# Patient Record
Sex: Male | Born: 1995 | Hispanic: No | Marital: Single | Smoking: Current every day smoker
Health system: Southern US, Community
[De-identification: ages and names within clinical notes are randomized; demographics above are authoritative.]

## PROBLEM LIST (undated history)

## (undated) DIAGNOSIS — F1910 Other psychoactive substance abuse, uncomplicated: Secondary | ICD-10-CM

---
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
## 2015-04-10 ENCOUNTER — Encounter (HOSPITAL_COMMUNITY): Payer: Self-pay | Admitting: Emergency Medicine

## 2015-04-10 ENCOUNTER — Emergency Department (HOSPITAL_COMMUNITY)
Admission: EM | Admit: 2015-04-10 | Discharge: 2015-04-10 | Disposition: A | Discharge status: Home / Self Care | Payer: No Typology Code available for payment source | Attending: Emergency Medicine | Admitting: Emergency Medicine

## 2015-04-10 DIAGNOSIS — S3992XA Unspecified injury of lower back, initial encounter: Secondary | ICD-10-CM | POA: Diagnosis present

## 2015-04-10 DIAGNOSIS — Y9389 Activity, other specified: Secondary | ICD-10-CM | POA: Diagnosis not present

## 2015-04-10 DIAGNOSIS — Z8659 Personal history of other mental and behavioral disorders: Secondary | ICD-10-CM | POA: Insufficient documentation

## 2015-04-10 DIAGNOSIS — T148 Other injury of unspecified body region: Secondary | ICD-10-CM | POA: Insufficient documentation

## 2015-04-10 DIAGNOSIS — Y92410 Unspecified street and highway as the place of occurrence of the external cause: Secondary | ICD-10-CM | POA: Insufficient documentation

## 2015-04-10 DIAGNOSIS — T07XXXA Unspecified multiple injuries, initial encounter: Secondary | ICD-10-CM

## 2015-04-10 DIAGNOSIS — Z720 Tobacco use: Secondary | ICD-10-CM | POA: Diagnosis not present

## 2015-04-10 DIAGNOSIS — Y998 Other external cause status: Secondary | ICD-10-CM | POA: Insufficient documentation

## 2015-04-10 DIAGNOSIS — S199XXA Unspecified injury of neck, initial encounter: Secondary | ICD-10-CM | POA: Diagnosis not present

## 2015-04-10 MED ORDER — TRAMADOL HCL 50 MG PO TABS: ORAL_TABLET | ORAL | Status: DC

## 2015-04-10 MED ORDER — ACETAMINOPHEN 500 MG PO TABS
500.0000 mg | ORAL_TABLET | Freq: Once | ORAL | Status: AC
  Administered 2015-04-10: 500 mg via ORAL
  Filled 2015-04-10: qty 1

## 2015-04-10 MED ORDER — IBUPROFEN 800 MG PO TABS: 800.0000 mg | ORAL_TABLET | Freq: Three times a day (TID) | ORAL | Status: DC

## 2015-04-10 MED ORDER — BACLOFEN 10 MG PO TABS: 10.0000 mg | ORAL_TABLET | Freq: Three times a day (TID) | ORAL | Status: AC

## 2015-04-10 MED ORDER — IBUPROFEN 800 MG PO TABS
800.0000 mg | ORAL_TABLET | Freq: Once | ORAL | Status: AC
  Administered 2015-04-10: 800 mg via ORAL
  Filled 2015-04-10: qty 1
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
## 2016-08-12 ENCOUNTER — Encounter (HOSPITAL_COMMUNITY): Payer: Self-pay

## 2016-08-12 DIAGNOSIS — K529 Noninfective gastroenteritis and colitis, unspecified: Secondary | ICD-10-CM | POA: Insufficient documentation

## 2016-08-12 DIAGNOSIS — F17200 Nicotine dependence, unspecified, uncomplicated: Secondary | ICD-10-CM | POA: Insufficient documentation

## 2016-08-12 DIAGNOSIS — Z79899 Other long term (current) drug therapy: Secondary | ICD-10-CM | POA: Insufficient documentation

## 2016-08-12 NOTE — ED Triage Notes (Signed)
I have been vomiting, dizzy, and had a headache.  Started around 5 pm today.

## 2016-08-13 ENCOUNTER — Emergency Department (HOSPITAL_COMMUNITY)
Admission: EM | Admit: 2016-08-13 | Discharge: 2016-08-13 | Disposition: A | Discharge status: Home / Self Care | Payer: Self-pay | Attending: Emergency Medicine | Admitting: Emergency Medicine

## 2016-08-13 DIAGNOSIS — K529 Noninfective gastroenteritis and colitis, unspecified: Secondary | ICD-10-CM

## 2016-08-13 MED ORDER — ONDANSETRON HCL 4 MG PO TABS: 4.0000 mg | ORAL_TABLET | Freq: Four times a day (QID) | ORAL | 0 refills | Status: DC

## 2016-08-13 MED ORDER — ONDANSETRON HCL 4 MG PO TABS
8.0000 mg | ORAL_TABLET | Freq: Once | ORAL | Status: AC
  Administered 2016-08-13: 8 mg via ORAL
  Filled 2016-08-13: qty 2

## 2016-08-13 MED ORDER — ONDANSETRON HCL 4 MG PO TABS: 4.0000 mg | ORAL_TABLET | Freq: Once | ORAL | Status: DC

## 2016-08-13 NOTE — ED Provider Notes (Signed)
[facility]-EMERGENCY DEPT Provider Note   CSN: [id number] Arrival date & time: 08/12/16  2249     History   Chief Complaint Chief Complaint  Patient presents with  . Emesis    HPI Joshua Mahoney is a 21 y.o. male.  The history is provided by the patient.  Emesis   This is a new problem. The current episode started 6 to 12 hours ago. The problem occurs 2 to 4 times per day. The problem has been gradually worsening. The emesis has an appearance of stomach contents. There has been no fever. Associated symptoms include headaches. Pertinent negatives include no abdominal pain, no arthralgias, no chills, no cough, no diarrhea, no fever, no myalgias and no URI.    Past Medical History:  Diagnosis Date  . Polysubstance abuse     There are no active problems to display for this patient.   History reviewed. No pertinent surgical history.     Home Medications    Prior to Admission medications   Medication Sig Start Date End Date Taking? Authorizing Provider  ibuprofen (ADVIL,MOTRIN) 800 MG tablet Take 1 tablet (800 mg total) by mouth 3 (three) times daily. 04/10/15   Ivery Quale, PA-C  traMADol Janean Sark) 50 MG tablet 1 or 2 po q6h prn pain 04/10/15   Ivery Quale, PA-C    Family History No family history on file.  Social History Social History  Substance Use Topics  . Smoking status: Current Every Day Smoker  . Smokeless tobacco: Never Used  . Alcohol use No     Allergies   Patient has no known allergies.   Review of Systems Review of Systems  Constitutional: Negative for activity change, chills and fever.       All ROS Neg except as noted in HPI  HENT: Negative for nosebleeds.   Eyes: Negative for photophobia and discharge.  Respiratory: Negative for cough, shortness of breath and wheezing.   Cardiovascular: Negative for chest pain and palpitations.  Gastrointestinal: Positive for vomiting. Negative for abdominal pain, blood in stool and diarrhea.  Genitourinary:  Negative for dysuria, frequency and hematuria.  Musculoskeletal: Negative for arthralgias, back pain, myalgias and neck pain.  Skin: Negative.   Neurological: Positive for dizziness and headaches. Negative for seizures and speech difficulty.  Psychiatric/Behavioral: Negative for confusion and hallucinations.     Physical Exam Updated Vital Signs BP 125/67 (BP Location: Left Arm)   Pulse 71   Temp 97.9 F (36.6 C) (Oral)   Resp 16   Ht 5\' 10"  (1.778 m)   Wt 79.4 kg   SpO2 100%   BMI 25.11 kg/m   Physical Exam  Constitutional: He is oriented to person, place, and time. He appears well-developed and well-nourished.  Non-toxic appearance.  HENT:  Head: Normocephalic.  Right Ear: Tympanic membrane and external ear normal.  Left Ear: Tympanic membrane and external ear normal.  Eyes: EOM and lids are normal. Pupils are equal, round, and reactive to light.  Neck: Normal range of motion. Neck supple. Carotid bruit is not present.  Cardiovascular: Normal rate, regular rhythm, normal heart sounds, intact distal pulses and normal pulses.   Pulmonary/Chest: Breath sounds normal. No respiratory distress.  Abdominal: Soft. Bowel sounds are normal. There is no tenderness. There is no guarding.  Musculoskeletal: Normal range of motion.  Lymphadenopathy:       Head (right side): No submandibular adenopathy present.       Head (left side): No submandibular adenopathy present.    He has no  cervical adenopathy.  Neurological: He is alert and oriented to person, place, and time. He has normal strength. No cranial nerve deficit or sensory deficit.  Skin: Skin is warm and dry.  Psychiatric: He has a normal mood and affect. His speech is normal.  Nursing note and vitals reviewed.    ED Treatments / Results  Labs (all labs ordered are listed, but only abnormal results are displayed) Labs Reviewed - No data to display  EKG  EKG Interpretation None       Radiology No results  found.  Procedures Procedures (including critical care time)  Medications Ordered in ED Medications - No data to display   Initial Impression / Assessment and Plan / ED Course  I have reviewed the triage vital signs and the nursing notes.  Pertinent labs & imaging results that were available during my care of the patient were reviewed by me and considered in my medical decision making (see chart for details).     *I have reviewed nursing notes, vital signs, and all appropriate lab and imaging results for this patient.**  Final Clinical Impressions(s) / ED Diagnoses  MDM Vital signs within normal limits. Patient will be treated with Zofran-O. Recheck. Patient states he's feeling better. Has not had any vomiting.  Recheck patient resting quietly. No vomiting since being in the emergency department or receiving medication. Patient will attempt drinking liquids for fluid challenge.   Recheck. No vomiting with fluid challenge. Patient will be given prescription for Zofran to use on. I suspect the patient has a viral gastroenteritis. Questions were answered. Patient is in agreement with this discharge plan.    Final diagnoses:  None    New Prescriptions New Prescriptions   No medications on file     Ivery QualeHobson Lothar Prehn, PA-C 08/13/16 0106    Shon Batonourtney F Horton, MD 08/13/16 0300

## 2016-08-13 NOTE — Discharge Instructions (Signed)
Your examination suggest a viral illness (viral gastroenteritis). Please use clear liquids over the next 24 hours, then gradually advance your diet to solid foods. Use Zofran every 6 hours for nausea/vomiting. Please see your primary physician, or return to the emergency department if any changes, problems, or concerns. Please wash hands frequently until the symptoms have resolved.

## 2016-08-13 NOTE — ED Notes (Signed)
Pt given sprite to drink. 

## 2018-04-13 ENCOUNTER — Encounter (HOSPITAL_COMMUNITY): Payer: Self-pay | Admitting: *Deleted

## 2018-04-13 ENCOUNTER — Other Ambulatory Visit: Payer: Self-pay

## 2018-04-13 ENCOUNTER — Emergency Department (HOSPITAL_COMMUNITY): Payer: Self-pay

## 2018-04-13 ENCOUNTER — Emergency Department (HOSPITAL_COMMUNITY)
Admission: EM | Admit: 2018-04-13 | Discharge: 2018-04-13 | Disposition: A | Discharge status: Home / Self Care | Payer: Self-pay | Attending: Emergency Medicine | Admitting: Emergency Medicine

## 2018-04-13 DIAGNOSIS — M549 Dorsalgia, unspecified: Secondary | ICD-10-CM | POA: Insufficient documentation

## 2018-04-13 DIAGNOSIS — R1012 Left upper quadrant pain: Secondary | ICD-10-CM | POA: Insufficient documentation

## 2018-04-13 DIAGNOSIS — Y92410 Unspecified street and highway as the place of occurrence of the external cause: Secondary | ICD-10-CM | POA: Insufficient documentation

## 2018-04-13 DIAGNOSIS — Y9389 Activity, other specified: Secondary | ICD-10-CM | POA: Insufficient documentation

## 2018-04-13 DIAGNOSIS — Z87891 Personal history of nicotine dependence: Secondary | ICD-10-CM | POA: Insufficient documentation

## 2018-04-13 DIAGNOSIS — Y998 Other external cause status: Secondary | ICD-10-CM | POA: Insufficient documentation

## 2018-04-13 DIAGNOSIS — S161XXA Strain of muscle, fascia and tendon at neck level, initial encounter: Secondary | ICD-10-CM
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
## 2018-04-13 NOTE — Discharge Instructions (Signed)
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
## 2018-04-13 NOTE — ED Notes (Signed)
Pt very argumentative at discharge stating "you mean to tell me there's nothing wrong with me."  Informed the scans and xrays we performed were normal.  Pt upset about this.  Ambulated to his friend's room without difficulty.  Refused repeat VS.

## 2018-04-13 NOTE — ED Notes (Signed)
Patient transported to CT 

## 2019-06-04 ENCOUNTER — Encounter (HOSPITAL_COMMUNITY): Payer: Self-pay

## 2019-06-04 ENCOUNTER — Other Ambulatory Visit: Payer: Self-pay

## 2019-06-04 ENCOUNTER — Emergency Department (HOSPITAL_COMMUNITY)
Admission: EM | Admit: 2019-06-04 | Discharge: 2019-06-04 | Disposition: A | Discharge status: Home / Self Care | Payer: Self-pay | Attending: Emergency Medicine | Admitting: Emergency Medicine

## 2019-06-04 ENCOUNTER — Inpatient Hospital Stay (HOSPITAL_COMMUNITY)
Admission: AD | Admit: 2019-06-04 | Discharge: 2019-06-09 | DRG: 885 | Disposition: A | Discharge status: Home / Self Care | Payer: No Typology Code available for payment source | Attending: Psychiatry | Admitting: Psychiatry

## 2019-06-04 DIAGNOSIS — F10239 Alcohol dependence with withdrawal, unspecified: Secondary | ICD-10-CM | POA: Diagnosis present

## 2019-06-04 DIAGNOSIS — F17210 Nicotine dependence, cigarettes, uncomplicated: Secondary | ICD-10-CM | POA: Insufficient documentation

## 2019-06-04 DIAGNOSIS — F23 Brief psychotic disorder: Secondary | ICD-10-CM | POA: Insufficient documentation

## 2019-06-04 DIAGNOSIS — Z20828 Contact with and (suspected) exposure to other viral communicable diseases: Secondary | ICD-10-CM | POA: Insufficient documentation

## 2019-06-04 DIAGNOSIS — Z7289 Other problems related to lifestyle: Secondary | ICD-10-CM | POA: Diagnosis not present

## 2019-06-04 DIAGNOSIS — F2081 Schizophreniform disorder: Principal | ICD-10-CM | POA: Diagnosis present

## 2019-06-04 DIAGNOSIS — G4700 Insomnia, unspecified: Secondary | ICD-10-CM | POA: Diagnosis present

## 2019-06-04 DIAGNOSIS — F29 Unspecified psychosis not due to a substance or known physiological condition: Secondary | ICD-10-CM | POA: Diagnosis not present

## 2019-06-04 DIAGNOSIS — R443 Hallucinations, unspecified: Secondary | ICD-10-CM | POA: Diagnosis present

## 2019-06-04 LAB — CBC
HCT: 46.3 % (ref 39.0–52.0)
Hemoglobin: 15.4 g/dL (ref 13.0–17.0)
MCH: 31.4 pg (ref 26.0–34.0)
MCHC: 33.3 g/dL (ref 30.0–36.0)
MCV: 94.5 fL (ref 80.0–100.0)
Platelets: 250 10*3/uL (ref 150–400)
RBC: 4.9 MIL/uL (ref 4.22–5.81)
RDW: 11.5 % (ref 11.5–15.5)
WBC: 17 10*3/uL — ABNORMAL HIGH (ref 4.0–10.5)
nRBC: 0 % (ref 0.0–0.2)

## 2019-06-04 LAB — COMPREHENSIVE METABOLIC PANEL
ALT: 31 U/L (ref 0–44)
AST: 37 U/L (ref 15–41)
Albumin: 4.7 g/dL (ref 3.5–5.0)
Alkaline Phosphatase: 59 U/L (ref 38–126)
Anion gap: 11 (ref 5–15)
BUN: 10 mg/dL (ref 6–20)
CO2: 26 mmol/L (ref 22–32)
Calcium: 9.1 mg/dL (ref 8.9–10.3)
Chloride: 102 mmol/L (ref 98–111)
Creatinine, Ser: 0.83 mg/dL (ref 0.61–1.24)
GFR calc Af Amer: >60 mL/min (ref >60)
GFR calc non Af Amer: >60 mL/min (ref >60)
Glucose, Bld: 96 mg/dL (ref 70–99)
Potassium: 3.9 mmol/L (ref 3.5–5.1)
Sodium: 139 mmol/L (ref 135–145)
Total Bilirubin: 0.7 mg/dL (ref 0.3–1.2)
Total Protein: 7.5 g/dL (ref 6.5–8.1)

## 2019-06-04 LAB — URINALYSIS, ROUTINE W REFLEX MICROSCOPIC
Bacteria, UA: NONE SEEN
Bilirubin Urine: NEGATIVE
Glucose, UA: NEGATIVE mg/dL
Hgb urine dipstick: NEGATIVE
Ketones, ur: NEGATIVE mg/dL
Leukocytes,Ua: NEGATIVE
Nitrite: NEGATIVE
Protein, ur: 30 mg/dL — AB
Specific Gravity, Urine: 1.024 (ref 1.005–1.030)
pH: 7 (ref 5.0–8.0)

## 2019-06-04 LAB — RAPID URINE DRUG SCREEN, HOSP PERFORMED
Amphetamines: NOT DETECTED
Barbiturates: NOT DETECTED
Benzodiazepines: NOT DETECTED
Cocaine: NOT DETECTED
Opiates: NOT DETECTED
Tetrahydrocannabinol: POSITIVE — AB

## 2019-06-04 LAB — SALICYLATE LEVEL: Salicylate Lvl: <7 mg/dL (ref 2.8–30.0)

## 2019-06-04 LAB — ACETAMINOPHEN LEVEL: Acetaminophen (Tylenol), Serum: <10 ug/mL — ABNORMAL LOW (ref 10–30)

## 2019-06-04 LAB — ETHANOL: Alcohol, Ethyl (B): <10 mg/dL (ref <10)

## 2019-06-04 LAB — SARS CORONAVIRUS 2 BY RT PCR (HOSPITAL ORDER, PERFORMED IN ~~LOC~~ HOSPITAL LAB): SARS Coronavirus 2: NEGATIVE

## 2019-06-04 MED ORDER — ACETAMINOPHEN 325 MG PO TABS
650.0000 mg | ORAL_TABLET | Freq: Four times a day (QID) | ORAL | Status: DC | PRN
  Administered 2019-06-06: 650 mg via ORAL
  Filled 2019-06-04: qty 2

## 2019-06-04 MED ORDER — LORAZEPAM 1 MG PO TABS: 1.0000 mg | ORAL_TABLET | ORAL | Status: DC | PRN

## 2019-06-04 MED ORDER — OLANZAPINE 10 MG IM SOLR
10.0000 mg | Freq: Once | INTRAMUSCULAR | Status: DC
  Filled 2019-06-04: qty 10

## 2019-06-04 MED ORDER — MAGNESIUM HYDROXIDE 400 MG/5ML PO SUSP: 30.0000 mL | Freq: Every day | ORAL | Status: DC | PRN

## 2019-06-04 MED ORDER — ALUM & MAG HYDROXIDE-SIMETH 200-200-20 MG/5ML PO SUSP: 30.0000 mL | ORAL | Status: DC | PRN

## 2019-06-04 MED ORDER — HYDROXYZINE HCL 25 MG PO TABS: 25.0000 mg | ORAL_TABLET | Freq: Three times a day (TID) | ORAL | Status: DC | PRN

## 2019-06-04 MED ORDER — DIPHENHYDRAMINE HCL 50 MG/ML IJ SOLN
50.0000 mg | Freq: Four times a day (QID) | INTRAMUSCULAR | Status: DC | PRN
  Administered 2019-06-05: 50 mg via INTRAMUSCULAR
  Filled 2019-06-04: qty 1

## 2019-06-04 MED ORDER — TRAZODONE HCL 100 MG PO TABS: 100.0000 mg | ORAL_TABLET | Freq: Every evening | ORAL | Status: DC | PRN

## 2019-06-04 MED ORDER — OLANZAPINE 5 MG PO TABS
5.0000 mg | ORAL_TABLET | Freq: Three times a day (TID) | ORAL | Status: DC | PRN
  Administered 2019-06-04: 13:00:00 5 mg via ORAL
  Filled 2019-06-04: qty 1

## 2019-06-04 MED ORDER — DIPHENHYDRAMINE HCL 50 MG/ML IJ SOLN
INTRAMUSCULAR | Status: AC
  Administered 2019-06-04: 50 mg via INTRAMUSCULAR
  Filled 2019-06-04: qty 1

## 2019-06-04 MED ORDER — DIPHENHYDRAMINE HCL 25 MG PO CAPS
50.0000 mg | ORAL_CAPSULE | Freq: Four times a day (QID) | ORAL | Status: DC | PRN
  Administered 2019-06-05: 50 mg via ORAL
  Filled 2019-06-04: qty 2

## 2019-06-04 MED ORDER — OLANZAPINE 10 MG PO TABS
10.0000 mg | ORAL_TABLET | Freq: Every day | ORAL | Status: DC
  Administered 2019-06-06 (×2): 10 mg via ORAL
  Filled 2019-06-04 (×4): qty 1

## 2019-06-04 MED ORDER — NICOTINE 21 MG/24HR TD PT24
21.0000 mg | MEDICATED_PATCH | Freq: Once | TRANSDERMAL | Status: DC
  Administered 2019-06-04: 13:00:00 21 mg via TRANSDERMAL
  Filled 2019-06-04: qty 1

## 2019-06-04 MED ORDER — ZIPRASIDONE MESYLATE 20 MG IM SOLR
20.0000 mg | Freq: Two times a day (BID) | INTRAMUSCULAR | Status: DC | PRN
  Administered 2019-06-04: 20 mg via INTRAMUSCULAR
  Filled 2019-06-04 (×2): qty 20

## 2019-06-04 NOTE — ED Provider Notes (Signed)
  Face-to-face evaluation   History: Patient presents for evaluation of delusional thoughts and hearing voices.  No reported command hallucinations.  He is taking Ambien to help him sleep but it is not helping.  Physical exam: He is agitated, appears angry, and walked out of the emergency department as I watched him, followed by security.  IVC commitment initiated-8:50 AM  Medical screening examination/treatment/procedure(s) were conducted as a shared visit with non-physician practitioner(s) and myself.  I personally evaluated the patient during the encounter   Daleen Bo, MD 06/05/19 [phone]

## 2019-06-04 NOTE — [facility] Counselor (Signed)
Pt accepted to [facility] 508-1 pending negative COVID test.

## 2019-06-04 NOTE — ED Notes (Signed)
IVC papers faxed to [facility] after being served by [facility].

## 2019-06-04 NOTE — ED Notes (Signed)
New contact # for [employer] [phone].  Called Danny at [facility] and informed of the new #.  Pt's father Audry Pili called and wanted update on the Pt.  Informed the nurse.
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
## 2019-06-04 NOTE — BH Assessment (Signed)
[facility] Assessment Progress Note  Per Neita Garnet, MD, this pt requires psychiatric hospitalization.  At 13:09 Kathalene Frames, RN tentatively assigned pt to [facility] Rm 501-1 pending negative Covid-19 test results.  Pt reportedly presents under IVC.  At 13:12 I called [facility] and spoke to Coronita, the charge nurse, to notify her of the tentative bed assignment and the conditions noted above.  She agrees to fax IVC documents to [phone].  Please call report to [phone] when the time comes.  At 13:15 I called Elmarie Shiley, NP and notified her.  At 13:16 I called Jacqulyn Cane, Patient Access Specialist, and notified him as well.  Pt is to be transported via Event [facility].   Jalene Mullet, [place] Coordinator [phone]

## 2019-06-04 NOTE — ED Notes (Signed)
Pt resting in bed at this time. 

## 2019-06-04 NOTE — BH Assessment (Addendum)
Tele Assessment Note   Patient Name: Joshua Mahoney MRN: [id number] Referring Physician: Effie Shy Location of [facility]: [facility] Location of Provider: Behavioral Health TTS Department  Joshua Mahoney is an 23 y.o. male who initially prsented to the [facility] voluntarily with the [facility], but became aggressive in the ED and was trying to leave and had to be placed on an IVC.  Per ED Report:  23 y.o. male who presents to the Emergency Department voluntarily by sheriff's dept, complaining of hearing voices and feelings of confusion.  Symptoms have been present for several weeks, but worse recently. He states that last evening he got into an arugment with his mother and left the house and was picked up by the police.  He states that he has been hearing voices that are telling him good things and other voices that are telling him bad things and he feeling like he doesn't know what he's suppose to do.  He states that he is a prophet of God, but that he also has the "mark of the beast."  He states that his mother is "always in my business and babies me." " I just was to go to sleep."  He denies suicidal or homicidal thoughts.  He was seen by Dr. Park Breed and given Remus Loffler, but he states that is not helping.  He denies fever, cough, abdominal pain, drug and alcohol use.  I spoke with his mother as well and she states that he has been agitated and has not slept in 48 hours.  She states he he has been telling her that if he goes to sleep that he will go to hell.  She also states that the Remus Loffler has made is agitation worse.  She contacted the police this morning after he broke her phone and he was telling her that he was in a coma.    TTS:  Patient was seen this morning and he appeared to be agitated, anxious and restless.  Patient admits to experiencing hallucinations and states that Ambien makes them worse.  Patient has not been sleeping well.  He presents in a psychotic state.  Patient states, "I said some things on Tic-Tok that I  should not have said."  He states that he has been hearing voices that tell him that he is an angel and that Lucifer was an angel so he must be Lucifer.  He stated that "technology is getting into my head and it is making me paranoid."  Patient states that technology has a magnetic pull on him. Patient states that he drank a bottle of wine last night trying to drown out the voices.  He denies other drug use, but had a positive UDS for marijuana.   Patient stated, "I have a new Facebook page, now I  am being accused of owning a kingdom.  I don't want to sin, I only have seven days left to live."  TTS attempted to contact patient's mother on several occasions at [phone]. But there was no answer and no voicemail.  TTS contacted [facility] twice to see if they could located mother in the ED waiting room, but they could not find her.  Patient presented as paranoid, responding to internal stimuli.  He was irritable and becoming combative.  He was alert and oriented x 3.  Due to his agitation, he was unable to answer many questions asked of him and was becoming very uncooperative.  He was able to state that he has never been in a [facility] in the past.  His memory was impaired and this thoughts loose and disorganized.  TTS contacted patient's mother, Joshua Mahoney at [phone] for collateral information.  Mother states that patient  Has not been able to sleep for two weeks and is up all hours of the night.  No sleep at all in the past two nights.  Mother states that patient has never had any hallucinations in the past.  He has no history of any mental health treatment and she states that he has never been suicidal or homicidal in the past.  Was unable to get further information from her because of poor reception.  The phone actually went dead.   Diagnosis: F23 Brief Psychotic Disorder  Past Medical History:  Past Medical History:  Diagnosis Date  . Polysubstance abuse ([place])      History reviewed. No pertinent surgical history.  Family History: No family history on file.  Social History:  reports that he has been smoking cigarettes. He has been smoking about 1.00 pack per day. He has never used smokeless tobacco. He reports current alcohol use. He reports that he does not use drugs.  Additional Social History:  Alcohol / Drug Use Pain Medications: see MAR Prescriptions: see MAR Over the Counter: see MAR History of alcohol / drug use?: Yes Longest period of sobriety (when/how long): unknown, unable to assess, patient is psychotic Substance #1 Name of Substance 1: Alcohol 1 - Age of First Use: UTA 1 - Amount (size/oz): 1 bottle wine 1 - Frequency: drank last night 1 - Duration: unknown 1 - Last Use / Amount: last pm  CIWA: CIWA-Ar BP: 140/85 Pulse Rate: 77 COWS:    Allergies: No Known Allergies  Home Medications: (Not in a hospital admission)   OB/GYN Status:  No LMP for male patient.  General Assessment Data Location of Assessment: [facility] TTS Assessment: In system Is this a Tele or Face-to-Face Assessment?: Tele Assessment Is this an Initial Assessment or a Re-assessment for this encounter?: Initial Assessment Patient Accompanied by:: Parent Language Other than English: No Living Arrangements: Other (Comment)(lives with boyfriend) What gender do you identify as?: Male Marital status: Single Living Arrangements: Parent Can pt return to current living arrangement?: Yes Admission Status: Involuntary Petitioner: ED Attending Is patient capable of signing voluntary admission?: No Referral Source: Self/Family/Friend Insurance type: self-pay     Crisis Care Plan Living Arrangements: Parent Legal Guardian: Other:(Dr Toy Care) Name of Psychiatrist: Dr. Toy Care Name of Therapist: none  Education Status Is patient currently in school?: No Is the patient employed, unemployed or receiving disability?: (UTA)  Risk to self with the past 6  months Suicidal Ideation: No Has patient been a risk to self within the past 6 months prior to admission? : No Suicidal Intent: No Has patient had any suicidal intent within the past 6 months prior to admission? : No Is patient at risk for suicide?: No Suicidal Plan?: No Has patient had any suicidal plan within the past 6 months prior to admission? : No Access to Means: No What has been your use of drugs/alcohol within the last 12 months?: drank a bottle of wine last pm Previous Attempts/Gestures: No How many times?: 0 Other Self Harm Risks: psychosis Triggers for Past Attempts: None known Intentional Self Injurious Behavior: None Family Suicide History: Unable to assess Recent stressful life event(s): Other (Comment) Persecutory voices/beliefs?: (none reported) Depression: Yes Depression Symptoms: Despondent, Insomnia, Loss of interest in usual pleasures, Feeling worthless/self pity Substance abuse history and/or treatment for  substance abuse?: Yes Suicide prevention information given to non-admitted patients: Not applicable  Risk to Others within the past 6 months Homicidal Ideation: No Does patient have any lifetime risk of violence toward others beyond the six months prior to admission? : No Thoughts of Harm to Others: No Current Homicidal Intent: No Current Homicidal Plan: No Access to Homicidal Means: No Identified Victim: none History of harm to others?: No Assessment of Violence: On admission Violent Behavior Description: patient has been combative and posturing toward staff, but has not hit anyone Does patient have access to weapons?: (UTA) Criminal Charges Pending?: No Does patient have a court date: No Is patient on probation?: No  Psychosis Hallucinations: Auditory Delusions: (paranoid delusions)  Mental Status Report Appearance/Hygiene: Unremarkable Eye Contact: Good Motor Activity: Restlessness, Tremors Speech: Pressured Level of Consciousness:  Alert Mood: Depressed, Anxious Affect: Irritable, Labile Anxiety Level: Severe Thought Processes: Flight of Ideas, Thought Blocking Judgement: Impaired Orientation: Person, Place, Time, Situation Obsessive Compulsive Thoughts/Behaviors: None  Cognitive Functioning Concentration: Decreased Memory: Recent Intact, Remote Intact Is patient IDD: No Insight: Poor Impulse Control: Poor Appetite: Good Have you had any weight changes? : No Change Sleep: Decreased Total Hours of Sleep: (patient has not been sleeping) Vegetative Symptoms: None  ADLScreening [facility] Assessment Services) Patient's cognitive ability adequate to safely complete daily activities?: Yes Patient able to express need for assistance with ADLs?: Yes Independently performs ADLs?: Yes (appropriate for developmental age)  Prior Inpatient Therapy Prior Inpatient Therapy: No  Prior Outpatient Therapy Prior Outpatient Therapy: Yes Prior Therapy Dates: active Prior Therapy Facilty/Provider(s): Dr. Evelene Croon Reason for Treatment: insomnia Does patient have an ACCT team?: No Does patient have Intensive In-House Services?  : No Does patient have [facility] services? : No Does patient have P4CC services?: No  ADL Screening (condition at time of admission) Patient's cognitive ability adequate to safely complete daily activities?: Yes Is the patient deaf or have difficulty hearing?: No Does the patient have difficulty seeing, even when wearing glasses/contacts?: No Does the patient have difficulty concentrating, remembering, or making decisions?: No Patient able to express need for assistance with ADLs?: Yes Does the patient have difficulty dressing or bathing?: No Independently performs ADLs?: Yes (appropriate for developmental age) Does the patient have difficulty walking or climbing stairs?: No Weakness of Legs: None Weakness of Arms/Hands: None  Home Assistive Devices/Equipment Home Assistive Devices/Equipment:  None  Therapy Consults (therapy consults require a physician order) PT Evaluation Needed: No OT Evalulation Needed: No SLP Evaluation Needed: No Abuse/Neglect Assessment (Assessment to be complete while patient is alone) Abuse/Neglect Assessment Can Be Completed: Yes Physical Abuse: Denies Verbal Abuse: Denies Sexual Abuse: Denies Exploitation of patient/patient's resources: Denies Self-Neglect: Denies Values / Beliefs Cultural Requests During Hospitalization: None Spiritual Requests During Hospitalization: None Consults Spiritual Care Consult Needed: No Social Work Consult Needed: No Merchant navy officer (For Healthcare) Does Patient Have a Medical Advance Directive?: No Would patient like information on creating a medical advance directive?: No - Patient declined Nutrition Screen- [facility] Adult/[facility] Has the patient recently lost weight without trying?: No Has the patient been eating poorly because of a decreased appetite?: No Malnutrition Screening Tool Score: 0        Disposition: Per Dr. Jama Flavors, Inpatient Treatment is recommended Disposition Initial Assessment Completed for this Encounter: Yes  This service was provided via telemedicine using a 2-way, interactive audio and video technology.  Names of all persons participating in this telemedicine service and their role in this encounter. Name: Mikale Silversmith Role: patient  Name: Dannielle HuhDanny Blake Vetrano Role: TTS  Name:  Role:   Name:  Role:     Daphene CalamityDanny J Ashlay Altieri 06/04/2019 9:46 AM

## 2019-06-04 NOTE — ED Notes (Signed)
Security at bedside.  Pt pacing in the room. Pt yelling at this RN saying " lies, next questions, lie next question. Give me my ipad to call my mom"

## 2019-06-04 NOTE — Progress Notes (Signed)
Reviewed patient's chart for medication recommendations due to extreme agitation from current psychotic state. Recommend zyprexa zydis 5 mg po every eight hours prn agitation. If patient refuses can give in form of zyprexa injection of 10 mg IM once.

## 2019-06-04 NOTE — ED Notes (Signed)
Pt wanded by security. 

## 2019-06-04 NOTE — BH Assessment (Signed)
[facility] Assessment Progress Note   TTS contacted patient's mother, Lenell Antu at [phone] for collateral information.  Mother states that patient  Has not been able to sleep for two weeks and is up all hours of the night.  No sleep at all in the past two nights.  Mother states that patient has never had any hallucinations in the past.  He has no history of any mental health treatment and she states that he has never been suicidal or homicidal in the past.  Was unable to get further information from her because of poor reception.  The phone actually went dead.

## 2019-06-04 NOTE — ED Notes (Addendum)
Pt speaking loudly and not making sense of his thoughts.  Pt came out of room and asked for the phone.  This RN went into the room to give the pt the phone.  Pt became angry, started to walk up the hall, tour his shirt off and started to say " im leaving, im going to see my mom. God forgive me, God forgive me"

## 2019-06-04 NOTE — ED Notes (Signed)
Pt resting at this time.

## 2019-06-04 NOTE — ED Notes (Signed)
Brother updated.

## 2019-06-04 NOTE — ED Notes (Signed)
Unable to obtain VS due to pt being agitated. Will not sit down to take vitals

## 2019-06-04 NOTE — ED Notes (Signed)
RSD at bedside.

## 2019-06-04 NOTE — ED Notes (Signed)
IVC papers taken out and faxed to the [facility].  Called to let them know that they were faxed.

## 2019-06-04 NOTE — ED Triage Notes (Signed)
Pt brought in voluntary by deputy due to hallucinations for over a week. Mother called sheriffs dept due to not understanding what is going on with son. Pt reports that he is seeing things and hearing voices. Pt denies SI/HI. Pt reports that he has recently been going to church and feels he has the mark of the beast. He has seen Dr Chancy Milroy in [place] and given [place]

## 2019-06-04 NOTE — ED Provider Notes (Signed)
[facility] EMERGENCY DEPARTMENT Provider Note   CSN: [id number] Arrival date & time: 06/04/19  6578     History   Chief Complaint Chief Complaint  Patient presents with  . Hallucinations    HPI Joshua Mahoney is a 23 y.o. male.     HPI   Joshua Mahoney is a 23 y.o. male who presents to the Emergency Department voluntarily by sheriff's dept, complaining of hearing voices and feelings of confusion.  Symptoms have been present for several weeks, but worse recently. He states that last evening he got into an arugment with his mother and left the house and was picked up by the police.  He states that he has been hearing voices that are telling him good things and other voices that are telling him bad things and he feeling like he doesn't know what he's suppose to do.  He states that he is a prophet of God, but that he also has the "mark of the beast."  He states that his mother is "always in my business and babies me." " I just was to go to sleep."  He denies suicidal or homicidal thoughts.  He was seen by Dr. Chancy Milroy and given Lorrin Mais, but he states that is not helping.  He denies fever, cough, abdominal pain, drug and alcohol use.  I spoke with his mother as well and she states that he has been agitated and has not slept in 48 hours.  She states he he has been telling her that if he goes to sleep that he will go to hell.  She also states that the Lorrin Mais has made is agitation worse.  She contacted the police this morning after he broke her phone and he was telling her that he was in a coma.       Past Medical History:  Diagnosis Date  . Polysubstance abuse ([place])     There are no active problems to display for this patient.   History reviewed. No pertinent surgical history.    Home Medications    Prior to Admission medications   Medication Sig Start Date End Date Taking? Authorizing Provider  ibuprofen (ADVIL,MOTRIN) 800 MG tablet Take 1 tablet (800 mg total) by mouth 3 (three) times  daily. 04/10/15   Lily Kocher, PA-C  ondansetron (ZOFRAN) 4 MG tablet Take 1 tablet (4 mg total) by mouth every 6 (six) hours. 08/13/16   Lily Kocher, PA-C  traMADol Veatrice Bourbon) 50 MG tablet 1 or 2 po q6h prn pain 04/10/15   Lily Kocher, PA-C    Family History No family history on file.  Social History Social History   Tobacco Use  . Smoking status: Current Every Day Smoker    Packs/day: 1.00    Types: Cigarettes  . Smokeless tobacco: Never Used  Substance Use Topics  . Alcohol use: Yes    Comment: bottle of wine last night  . 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   Review of Systems Review of Systems  Unable to perform ROS: Psychiatric disorder     Physical Exam Updated Vital Signs BP 140/85 (BP Location: Right Arm)   Pulse 77   Temp 97.9 F (36.6 C) (Oral)   Resp 18   Ht 5\' 11"  (1.803 m)   Wt 75.8 kg   SpO2 98%   BMI 23.29 kg/m   Physical Exam Vitals signs and nursing note reviewed.  Constitutional:      General: He is not in acute distress.    Appearance: He is not toxic-appearing.  HENT:     Head: Atraumatic.  Eyes:     Extraocular Movements: Extraocular movements intact.  Neck:     Musculoskeletal: Normal range of motion.  Cardiovascular:     Rate and Rhythm: Normal rate and regular rhythm.     Pulses: Normal pulses.  Pulmonary:     Effort: Pulmonary effort is normal. No respiratory distress.     Breath sounds: Normal breath sounds.  Musculoskeletal: Normal range of motion.     Right lower leg: No edema.     Left lower leg: No edema.  Skin:    General: Skin is warm.     Capillary Refill: Capillary refill takes less than 2 seconds.     Findings: No rash.  Neurological:     Mental Status: He is alert.     Sensory: No sensory deficit.     Motor: No weakness.  Psychiatric:        Attention and Perception: He perceives auditory hallucinations.        Mood and Affect: Mood  is anxious. Affect is angry.        Speech: Speech is tangential.        Behavior: Behavior is agitated.        Thought Content: Thought content does not include homicidal or suicidal ideation. Thought content does not include homicidal or suicidal plan.      ED Treatments / Results  Labs (all labs ordered are listed, but only abnormal results are displayed) Labs Reviewed  ACETAMINOPHEN LEVEL - Abnormal; Notable for the following components:      Result Value   Acetaminophen (Tylenol), Serum <10 (*)    All other components within normal limits  CBC - Abnormal; Notable for the following components:   WBC 17.0 (*)    All other components within normal limits  RAPID URINE DRUG SCREEN, HOSP PERFORMED - Abnormal; Notable for the following components:   Tetrahydrocannabinol POSITIVE (*)    All other components within normal limits  URINALYSIS, ROUTINE W REFLEX MICROSCOPIC - Abnormal; Notable for the following components:   APPearance HAZY (*)    Protein, ur 30 (*)    All other components within normal limits  COMPREHENSIVE METABOLIC PANEL  ETHANOL  SALICYLATE LEVEL    EKG None  Radiology No results found.  Procedures Procedures (including critical care time)  Medications Ordered in ED Medications - No data to display   Initial Impression / Assessment and Plan / ED Course  I have reviewed the triage vital signs and the nursing notes.  Pertinent labs & imaging results that were available during my care of the patient were reviewed by me and considered in my medical decision making (see chart for details).    Pt appears to be responding to internal stimuli, slightly agitated during my evaluation.  Delusional.    after my initial evaluation, I was informed by nursing that he was becoming agitated and threatening to leave.  I have discussed with Dr. Effie ShyWentz and IVC paperwork initiated.     Patient has been evaluated by TTS.  [facility]    Patient  remains agitated.  Review of psychiatry nurse practitioner note, recommends p.o. Zyprexa every 8 hours for agitation.  May give IM injection 10 mg if patient refuses p.o. dosing  On recheck, pt given po Zyprexa and remains cooperative and resting.  Has bed at [facility] pending COVID test results.   Final Clinical Impressions(s) / ED Diagnoses   Final diagnoses:  None    ED Discharge Orders    None       Pauline Aus, PA-C 06/04/19 1536    Mancel Bale, MD 06/05/19 [phone]

## 2019-06-04 NOTE — Progress Notes (Addendum)
Writer asked to review patient upon admission to the unit as patient was requiring physical restraint by 4 police officers prior to walking onto the unit. Nursing staff prepared for STARR and restraint chair was made available. Per chart review mother denies any previous psychosis, past psych history or family history. However patient is endorsing psychosis and hallucination and has been combative while in the ER. UDS positive for THC. Patient is most likely naive to antipsychotics. Will order agitation protocol GEODON 20MG  IM IN A SINGLE DOSE Q12 HOURS, ATIVAN 1MG  PO/IM Q6HR FOR AGITATION, BENADRYL 50MG  PO/IM Q6 HOUR FOR AGITATION AND EPS. WILL START OLANZAPINE 10MG  PO QHS FOR ACUTE PSYCHOSIS AND HALLUCINATIONS. NURSE JACKIE HAS BEEN MADE AWARE TO USE ATIVAN SPARINGLY IN LIEU OF ZYPREXA THAT HAS BEEN ORDERED AS WELL. Will order EKG as one was not obtained while in the ED due patient level of aggression and combativeness.  Medications were administered without difficultly in triage area with [facility] staff, [employer], and [employer]. Please see nurse note. Patient was put in chair and escorted to the unit, he was able to be deescalated although some paranoia was noted. This NP evaluated the patient. No acute distress, no physical pain. No sign of injuries.   Attest to NP Progress Note

## 2019-06-05 ENCOUNTER — Encounter (HOSPITAL_COMMUNITY): Payer: Self-pay

## 2019-06-05 DIAGNOSIS — F29 Unspecified psychosis not due to a substance or known physiological condition: Secondary | ICD-10-CM

## 2019-06-05 MED ORDER — OLANZAPINE 10 MG PO TBDP
10.0000 mg | ORAL_TABLET | Freq: Three times a day (TID) | ORAL | Status: DC | PRN
  Administered 2019-06-05 – 2019-06-06 (×2): 10 mg via ORAL
  Filled 2019-06-05: qty 1

## 2019-06-05 MED ORDER — LORAZEPAM 1 MG PO TABS
1.0000 mg | ORAL_TABLET | ORAL | Status: AC | PRN
  Administered 2019-06-08: 04:00:00 1 mg via ORAL
  Filled 2019-06-05 (×4): qty 1

## 2019-06-05 MED ORDER — LOPERAMIDE HCL 2 MG PO CAPS: 2.0000 mg | ORAL_CAPSULE | ORAL | Status: DC | PRN

## 2019-06-05 MED ORDER — NICOTINE 21 MG/24HR TD PT24
MEDICATED_PATCH | TRANSDERMAL | Status: AC
  Administered 2019-06-05: 21:00:00
  Filled 2019-06-05: qty 1

## 2019-06-05 MED ORDER — ZIPRASIDONE MESYLATE 20 MG IM SOLR
20.0000 mg | Freq: Two times a day (BID) | INTRAMUSCULAR | Status: DC | PRN
  Administered 2019-06-05: 20 mg via INTRAMUSCULAR

## 2019-06-05 MED ORDER — VITAMIN B-1 100 MG PO TABS
100.0000 mg | ORAL_TABLET | Freq: Every day | ORAL | Status: DC
  Administered 2019-06-05 – 2019-06-09 (×5): 100 mg via ORAL
  Filled 2019-06-05 (×9): qty 1

## 2019-06-05 MED ORDER — ONDANSETRON 4 MG PO TBDP: 4.0000 mg | ORAL_TABLET | Freq: Four times a day (QID) | ORAL | Status: DC | PRN

## 2019-06-05 MED ORDER — LORAZEPAM 1 MG PO TABS: 1.0000 mg | ORAL_TABLET | ORAL | Status: DC | PRN

## 2019-06-05 MED ORDER — ZIPRASIDONE MESYLATE 20 MG IM SOLR
20.0000 mg | INTRAMUSCULAR | Status: AC | PRN
  Administered 2019-06-08: 15:00:00 20 mg via INTRAMUSCULAR
  Filled 2019-06-05: qty 20

## 2019-06-05 MED ORDER — LORAZEPAM 1 MG PO TABS
1.0000 mg | ORAL_TABLET | Freq: Four times a day (QID) | ORAL | Status: AC | PRN
  Administered 2019-06-05 – 2019-06-07 (×6): 1 mg via ORAL
  Filled 2019-06-05 (×4): qty 1

## 2019-06-05 MED ORDER — ADULT MULTIVITAMIN W/MINERALS CH
1.0000 | ORAL_TABLET | Freq: Every day | ORAL | Status: DC
  Administered 2019-06-05 – 2019-06-09 (×5): 1 via ORAL
  Filled 2019-06-05 (×9): qty 1

## 2019-06-05 MED ORDER — NICOTINE 21 MG/24HR TD PT24
21.0000 mg | MEDICATED_PATCH | Freq: Every day | TRANSDERMAL | Status: DC
  Administered 2019-06-06 – 2019-06-09 (×4): 21 mg via TRANSDERMAL
  Filled 2019-06-05 (×8): qty 1

## 2019-06-05 NOTE — Progress Notes (Signed)
Patient appears paranoid and is very concerned with trying to figure out if he is dead or alive. Joshua Mahoney apparently has been speaking with him today and he reports that he does not like talking to him. He has been to the nursing station several times for reassurance that he is safe and want him to try and relax. Patient reports that it is hard for him to relax and writer gave him 1 mg ativan to help, Will monitor effectiveness of medication. Safety maintained with 15 min checks.
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
## 2019-06-05 NOTE — Progress Notes (Signed)
Patient ID: Pernell Saggese, male   DOB: 09/11/1995, 23 y.o.   MRN: [id number]    [facility] NOVEL CORONAVIRUS (COVID-19) DAILY CHECK-OFF SYMPTOMS - answer yes or no to each - every day NO YES  Have you had a fever in the past 24 hours?  . Fever (Temp > 37.80C / 100F) X   Have you had any of these symptoms in the past 24 hours? . New Cough .  Sore Throat  .  Shortness of Breath .  Difficulty Breathing .  Unexplained Body Aches   X   Have you had any one of these symptoms in the past 24 hours not related to allergies?   . Runny Nose .  Nasal Congestion .  Sneezing   X   If you have had runny nose, nasal congestion, sneezing in the past 24 hours, has it worsened?  X   EXPOSURES - check yes or no X   Have you traveled outside the state in the past 14 days?  X   Have you been in contact with someone with a confirmed diagnosis of COVID-19 or PUI in the past 14 days without wearing appropriate PPE?  X   Have you been living in the same home as a person with confirmed diagnosis of COVID-19 or a PUI (household contact)?    X   Have you been diagnosed with COVID-19?    X              What to do next: Answered NO to all: Answered YES to anything:   Proceed with unit schedule Follow the [facility] Inpatient Flowsheet.   

## 2019-06-05 NOTE — Progress Notes (Signed)
Patient brought on the unit in restraint chair because of refusing to cooperate with staff to help get him to walk into the search room to start his admission. He had already yelled and used profanity with officer who brought  him into the entrance. He would not cooperate after much encouragement from [facility], Probation officer and M. Hardin Negus. Order received to administer medications. Patient continues to cuss, yell and demanding [place] to leave him the fuck alone. Patient placed in restraint chair, medications given and patient brought onto unit. He refused to complete admission.

## 2019-06-05 NOTE — Progress Notes (Signed)
Patient attempted to elope from 500 hall at 1730.  As nurse was going through the 500 double doors, patient charged through the double doors and was intercepted by a MHT that was at the nurse's station. Patient did not make if off the unit. He refused to return to 500 hall initially; however, MHT was able to coax him back to the 500 hall.  Patient is now a unit restriction because of attempted elopement.

## 2019-06-05 NOTE — Progress Notes (Addendum)
Patient hit call button in his room asking for headphones to listen to music or a tv in his room with netflix. Writer explained this would not be possible and dayroom opens at 6 am for him to watch tv.  Safety maintained.

## 2019-06-05 NOTE — [facility] Group Notes (Signed)
  [facility] LCSW Group Therapy Note  Date/Time:  06/05/2019 11:15AM-12:00PM  Type of Therapy and Topic:  Group Therapy:  Feelings About Hospitalization  Participation Level:  Active   Description of Group This process group involved patients discussing their feelings related to being hospitalized, as well as the benefits they see to being in the hospital.  These feelings and benefits were itemized.  The group then brainstormed specific ways in which they could seek those same benefits when they discharge and return home.  Therapeutic Goals 1. Patient will identify and describe positive and negative feelings related to hospitalization 2. Patient will verbalize benefits of hospitalization to themselves personally 3. Patients will brainstorm together ways they can obtain similar benefits in the outpatient setting, identify barriers to wellness and possible solutions  Summary of Patient Progress:  The patient expressed his primary feelings about being hospitalized are pretty good, but overwhelming.  He said getting the proper treatment would make him feel "great."  He moved around the room quite a bit and was both distractible and distracting.  He was pleasant and interactive most of the time.  Therapeutic Modalities Cognitive Behavioral Therapy Motivational Interviewing    Selmer Dominion, LCSW 06/05/2019, 2:21 PM

## 2019-06-05 NOTE — Progress Notes (Signed)
Patient up and came down the hall and went to the doors pulling and pushing on them reporting that we don't understand he needs to get out of here. Writer informed him he would not be able to leave and will see the doctor in the morning. He then attempted to open the emergency door override. Writer informed him that he needed to return to his room unless willing to take medication to help him calm down. He returned to his room reporting that he did not want anymore medicines. Patient is safe currently.
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
## 2019-06-05 NOTE — Progress Notes (Signed)
Patient is currently asleep and with no distress noted.

## 2019-06-05 NOTE — Progress Notes (Signed)
Patient came up for crayons to draw with or a pencil to write with. He requested a cup of coffee and it was explained that coffee gets served at 6 am. He became tearful and attempted top sit in mhts chair while she was getting him the items requested. He then wanted to walk the hallway and writer suggested that he not. Explained to him with no roommate in his room he could walk around in his room instead. Safety maintained with 15 min checks.

## 2019-06-05 NOTE — Progress Notes (Signed)
Patient hit cirt button in his room again to request paper and colored pencils or crayons so he could draw and Probation officer. It was explained to him again not to use the button to call for items. He reported that he understood.

## 2019-06-05 NOTE — Progress Notes (Addendum)
Patient hit cirt bell again and when writer walked to his room patient had used the pencil and wrote on his walls about coffee, random words and phrases. Patient reported that he wanted to talk to someone and asked that we call Trump. Writer received orders for Geodon and Benadryl IM which he received. Will monitor effectiveness of medicine.

## 2019-06-05 NOTE — Plan of Care (Signed)
  Problem: Education: Goal: Emotional status will improve Outcome: Not Progressing Goal: Mental status will improve Outcome: Not Progressing   Problem: Coping: Goal: Ability to demonstrate self-control will improve Outcome: Not Progressing    D: Pt alert and oriented on the unit. Pt's affect was flat and sad. Pt was paranoid on the unit stated that he didn't want to go into his room because it was a box and someone was trying to trap him. Pt paced the hallway reading his Bible out loud. Pt also walked off the unit behind staff. Pt was redirectable. A: Education, support and encouragement provided, q15 minute safety checks remain in effect. Medications administered per MD orders. R: No reactions/side effects to medicine noted. Pt denies any concerns at this time, and verbally contracts for safety. Pt ambulating on the unit with no issues. Pt remains safe on the unit.

## 2019-06-06 DIAGNOSIS — F23 Brief psychotic disorder: Secondary | ICD-10-CM

## 2019-06-06 MED ORDER — TRAZODONE HCL 50 MG PO TABS
50.0000 mg | ORAL_TABLET | Freq: Every evening | ORAL | Status: DC | PRN
  Administered 2019-06-06 – 2019-06-08 (×3): 50 mg via ORAL
  Filled 2019-06-06 (×3): qty 1

## 2019-06-06 MED ORDER — HYDROXYZINE HCL 25 MG PO TABS
25.0000 mg | ORAL_TABLET | Freq: Three times a day (TID) | ORAL | Status: DC | PRN
  Administered 2019-06-06 – 2019-06-08 (×6): 25 mg via ORAL
  Filled 2019-06-06 (×6): qty 1

## 2019-06-06 NOTE — Progress Notes (Signed)
   06/06/19 1000  Psych Admission Type (Psych Patients Only)  Admission Status Involuntary  Psychosocial Assessment  Patient Complaints Anxiety  Eye Contact Brief  Facial Expression Anxious  Affect Labile  Speech Logical/coherent  Interaction Assertive  Motor Activity Pacing  Appearance/Hygiene Unremarkable  Behavior Characteristics Anxious;Cooperative  Mood Anxious;Preoccupied  Thought Community education officer  Content Paranoia  Delusions Paranoid  Perception Hallucinations  Hallucination Visual  Judgment Impaired  Confusion Severe  Danger to Self  Current suicidal ideation? Denies  Danger to Others  Danger to Others None reported or observed

## 2019-06-06 NOTE — BHH Group Notes (Signed)
[facility] LCSW Group Therapy Note  Date/Time:  06/06/2019  11:00AM-12:00PM  Type of Therapy and Topic:  Group Therapy:  Music and Mood  Participation Level:  Active   Description of Group: In this process group, members listened to a variety of genres of music and identified that different types of music evoke different responses.  Patients were encouraged to identify music that was soothing for them and music that was energizing for them.  Patients discussed how this knowledge can help with wellness and recovery in various ways including managing depression and anxiety as well as encouraging healthy sleep habits.    Therapeutic Goals: 1. Patients will explore the impact of different varieties of music on mood 2. Patients will verbalize the thoughts they have when listening to different types of music 3. Patients will identify music that is soothing to them as well as music that is energizing to them 4. Patients will discuss how to use this knowledge to assist in maintaining wellness and recovery 5. Patients will explore the use of music as a coping skill  Summary of Patient Progress:  At the beginning of group, patient expressed that he felt "amazing, great."  He was labile during group, would go from crying during a song to reading verses out loud, to [employer] on sheets of paper.  At the end of group, patient expressed that he felt good, saying "it's good to get stuff off your chest."  He was provided with a journal.  He visibly relaxed once he was allowed to choose a song.    Therapeutic Modalities: Solution Focused Brief Therapy Activity   Selmer Dominion, LCSW

## 2019-06-06 NOTE — Plan of Care (Signed)
  Problem: Activity: Goal: Interest or engagement in activities will improve Outcome: Progressing   Problem: Coping: Goal: Ability to demonstrate self-control will improve Outcome: Progressing   Problem: Nutritional: Goal: Ability to achieve adequate nutritional intake will improve Outcome: Progressing   Problem: Role Relationship: Goal: Ability to communicate needs accurately will improve Outcome: Progressing Goal: Ability to interact with others will improve Outcome: Progressing

## 2019-06-06 NOTE — Progress Notes (Addendum)
[facility] MD Progress Note  06/06/2019 11:18 AM Joshua Mahoney  MRN:  [id number] Subjective:  "I'm fine."  Joshua Mahoney found sitting in the dayroom. He presents with fuller range of affect today, more organized speech, and reports improving mood. He remains delusional. Per nursing report he eloped off the 500 hall yesterday evening and required extensive encouragement to return to the unit. He reports that he was reading the Bible, and the book told him to run away. He reports "I'm ready to leave" and is requesting discharge. Patient advised he will be held for continued monitoring due to recent admission with acute agitation and aggression. He becomes irritable when told he is not discharging. He is Joshua preoccupied with religious delusions today. He states that Lucifer and Ysidro Evert are still on either shoulder, but he is focusing on himself instead. He denies withdrawal symptoms from alcohol. He presents with delusional thought content but shows no signs of responding to internal stimuli. He denies SI/HI/AVH.  From admission H&P:  Joshua Mahoney is a 23 year old male with history of polysubstance abuse presenting for treatment of new onset psychosis. Per chart review he was brought in voluntarily by law enforcement due to hallucinations. Per mother's report he had been agitated and awake for 48 hours. He was significantly agitated in the ED and on transfer to [facility] yesterday evening, requiring IM medications. He presents with mildly pressured speech, disorganized behaviors, and tangential, hyper-religious thought content.   Principal Problem: <principal problem not specified> Diagnosis: Active Problems:   Psychosis ([place])  Total Time spent with patient: 15 minutes  Past Psychiatric History: See admission H&P  Past Medical History:  Past Medical History:  Diagnosis Date  . Polysubstance abuse ([place])    History reviewed. No pertinent surgical history. Family History: History reviewed. No pertinent family  history. Family Psychiatric  History: See admission H&P Social History:  Social History   Substance and Sexual Activity  Alcohol Use Yes   Comment: bottle of wine last night     Social History   Substance and Sexual Activity  Drug Use No   Comment: heroin, xanax last use years ago    Social History   Socioeconomic History  . Marital status: Single    Spouse name: Not on file  . Number of children: Not on file  . Years of education: Not on file  . Highest education level: Not on file  Occupational History  . Not on file  Social Needs  . Financial resource strain: Patient refused  . Food insecurity    Worry: Patient refused    Inability: Patient refused  . Transportation needs    Medical: Patient refused    Non-medical: Patient refused  Tobacco Use  . Smoking status: Current Every Day Smoker    Packs/day: 1.00    Types: Cigarettes  . Smokeless tobacco: Never Used  Substance and Sexual Activity  . Alcohol use: Yes    Comment: bottle of wine last night  . Drug use: No    Comment: heroin, xanax last use years ago  . Sexual activity: Not on file  Lifestyle  . Physical activity    Days per week: Patient refused    Minutes per session: Patient refused  . Stress: Not on file  Relationships  . Social Herbalist on phone: Patient refused    Gets together: Patient refused    Attends religious service: Patient refused    Active member of club or organization: Patient refused  Attends meetings of clubs or organizations: Patient refused    Relationship status: Patient refused  Other Topics Concern  . Not on file  Social History Narrative  . Not on file   Additional Social History:                         Sleep: Good  Appetite:  Good  Current Medications: Current Facility-Administered Medications  Medication Dose Route Frequency Provider Last Rate Last Dose  . acetaminophen (TYLENOL) tablet 650 mg  650 mg Oral Q6H PRN Maryagnes AmosStarkes-Perry, Takia  S, FNP   650 mg at 06/06/19 0156  . alum & mag hydroxide-simeth (MAALOX/MYLANTA) 200-200-20 MG/5ML suspension 30 mL  30 mL Oral Q4H PRN Rosario AdieStarkes-Perry, Juel Burrowakia S, FNP      . OLANZapine zydis (ZYPREXA) disintegrating tablet 10 mg  10 mg Oral Q8H PRN Emunah Texidor, Rockey SituFernando A, MD   10 mg at 06/06/19 0815   And  . LORazepam (ATIVAN) tablet 1 mg  1 mg Oral PRN Kyla Duffy, Rockey SituFernando A, MD       And  . ziprasidone (GEODON) injection 20 mg  20 mg Intramuscular PRN Lorik Guo, Rockey SituFernando A, MD      . LORazepam (ATIVAN) tablet 1 mg  1 mg Oral Q6H PRN Jakevion Arney, Rockey SituFernando A, MD   1 mg at 06/06/19 0731  . magnesium hydroxide (MILK OF MAGNESIA) suspension 30 mL  30 mL Oral Daily PRN Maryagnes AmosStarkes-Perry, Takia S, FNP      . multivitamin with minerals tablet 1 tablet  1 tablet Oral Daily Zaquan Duffner, Rockey SituFernando A, MD   1 tablet at 06/06/19 0731  . nicotine (NICODERM CQ - dosed in mg/24 hours) patch 21 mg  21 mg Transdermal Daily Malvin JohnsFarah, Brian, MD   21 mg at 06/06/19 0731  . OLANZapine (ZYPREXA) tablet 10 mg  10 mg Oral QHS Maryagnes AmosStarkes-Perry, Takia S, FNP   10 mg at 06/06/19 0157  . thiamine (VITAMIN B-1) tablet 100 mg  100 mg Oral Daily Jourdain Guay, Rockey SituFernando A, MD   100 mg at 06/06/19 16100731    Lab Results:  Results for orders placed or performed during the hospital encounter of 06/04/19 (from the past 48 hour(s))  SARS Coronavirus 2 by RT PCR (hospital order, performed in [facility] hospital lab)     Status: None   Collection Time: 06/04/19  2:14 PM  Result Value Ref Range   SARS Coronavirus 2 NEGATIVE NEGATIVE    Comment: (NOTE) SARS-CoV-2 target nucleic acids are NOT DETECTED. The SARS-CoV-2 RNA is generally detectable in upper and lower respiratory specimens during the acute phase of infection. The lowest concentration of SARS-CoV-2 viral copies this assay can detect is 250 copies / mL. A negative result does not preclude SARS-CoV-2 infection and should not be used as the sole basis for treatment or other patient management decisions.  A negative  result may occur with improper specimen collection / handling, submission of specimen other than nasopharyngeal swab, presence of viral mutation(s) within the areas targeted by this assay, and inadequate number of viral copies (<250 copies / mL). A negative result must be combined with clinical observations, patient history, and epidemiological information. Fact Sheet for Patients:   BoilerBrush.com.cyhttps://www.fda.gov/media/136312/download Fact Sheet for Healthcare Providers: https://pope.com/https://www.fda.gov/media/136313/download This test is not yet approved or cleared  by the [place] FDA and has been authorized for detection and/or diagnosis of SARS-CoV-2 by FDA under an Emergency Use Authorization (EUA).  This EUA will remain in effect (meaning this test can be  used) for the duration of the COVID-19 declaration under Section 564(b)(1) of the Act, 21 U.S.C. section 360bbb-3(b)(1), unless the authorization is terminated or revoked sooner. Performed at [facility], [address] [place], [place] [address]     Blood Alcohol level:  Lab Results  Component Value Date   [facility] <10 06/04/2019    Metabolic Disorder Labs: No results found for: HGBA1C, MPG No results found for: PROLACTIN No results found for: CHOL, TRIG, HDL, CHOLHDL, VLDL, LDLCALC  Physical Findings: AIMS: Facial and Oral Movements Muscles of Facial Expression: None, normal Lips and Perioral Area: None, normal Jaw: None, normal Tongue: None, normal,Extremity Movements Upper (arms, wrists, hands, fingers): None, normal Lower (legs, knees, ankles, toes): None, normal, Trunk Movements Neck, shoulders, hips: None, normal, Overall Severity Severity of abnormal movements (highest score from questions above): None, normal Incapacitation due to abnormal movements: None, normal Patient's awareness of abnormal movements (rate only patient's report): No Awareness, Dental Status Current problems with teeth and/or dentures?: No Does patient  usually wear dentures?: No  CIWA:  CIWA-Ar Total: 3 COWS:     Musculoskeletal: Strength & Muscle Tone: within normal limits Gait & Station: normal Patient leans: N/A  Psychiatric Specialty Exam: Physical Exam  Nursing note and vitals reviewed. Constitutional: He is oriented to person, place, and time. He appears well-developed and well-nourished.  Cardiovascular: Normal rate.  Respiratory: Effort normal.  Neurological: He is alert and oriented to person, place, and time.    Review of Systems  Constitutional: Negative.   Respiratory: Negative for cough and shortness of breath.   Cardiovascular: Negative for chest pain.  Psychiatric/Behavioral: Positive for substance abuse (THC). Negative for depression, hallucinations and suicidal ideas. The patient is nervous/anxious. The patient does not have insomnia.     Blood pressure 136/77, pulse 100, temperature (!) 97.2 F (36.2 C), temperature source Oral, resp. rate 18.There is no height or weight on file to calculate BMI.  General Appearance: Casual  Eye Contact:  Good  Speech:  Normal Rate  Volume:  Normal  Mood:  Irritable  Affect:  Congruent  Thought Process:  Coherent  Orientation:  Full (Time, Place, and Person)  Thought Content:  Delusions  Suicidal Thoughts:  No  Homicidal Thoughts:  No  Memory:  Immediate;   Fair Recent;   Fair  Judgement:  Impaired  Insight:  Lacking  Psychomotor Activity:  Normal  Concentration:  Concentration: Fair and Attention Span: Fair  Recall:  [employer] of Knowledge:  Fair  Language:  Fair  Akathisia:  No  Handed:  Right  AIMS (if indicated):     Assets:  Communication Skills Desire for Improvement Housing Social Support  ADL's:  Intact  Cognition:  WNL  Sleep:  Number of Hours: 5     Treatment Plan Summary: Daily contact with patient to assess and evaluate symptoms and progress in treatment and Medication management   Continue inpatient hospitalization.  Continue Zyprexa 10  mg PO QHS for psychosis Continue Ativan 1 mg PO Q6HR PRN CIWA>10 for ETOH withdrawal Continue agitation protocol PRN agitation Continue Vistaril 25 mg PO TID PRN anxiety Continue trazodone 50 mg PO QHS PRN insomnia  Patient will participate in the therapeutic group milieu.  Discharge disposition in progress.   Aldean Baker, NP 06/06/2019, 11:18 AM   Attest to NP Progress Note

## 2019-06-06 NOTE — Progress Notes (Signed)
Adult Psychoeducational Group Note  Date:  06/06/2019 Time:  2:05 AM  Group Topic/Focus:  Wrap-Up Group:   The focus of this group is to help patients review their daily goal of treatment and discuss progress on daily workbooks.  Participation Level:  Minimal  Participation Quality:  Appropriate  Affect:  Irritable, Labile, Lethargic and Not Congruent  Cognitive:  Disorganized, Confused, Delusional and Hallucinating  Insight: Lacking and Limited  Engagement in Group:  Lacking, Limited, None, Off Topic and Poor  Modes of Intervention:  Discussion  Additional Comments:  Pt stated his goal for today was to focus on his treatment plan. Pt stated he felt he accomplished his goal today. Pt stated his relationship with his family has improved since he was admitted here. Pt stated he was able to contact his mother today, which improved his day.  Pt stated he felt better about himself today. Pt rated his overall day an 6 out of 10. Pt stated his appetite has improved today. Pt stated his goal for tonight was to get some rest. Pt stated he was in no physical pain today. Pt stated he was hearing and seeing things that was not there today. Writer asked pt when was the last time this happen he said 15 minutes ago. Pt stated he was trying to figure out if he was dead or alive. Pt was made aware by writer that he is safe here and we are here to help. Pt's nurse was made aware of the situation. Pt stated he had no thoughts of harming himself or others today. Pt state if anything change he would alert staff.  [name] 06/06/2019, 2:05 AM

## 2019-06-06 NOTE — Progress Notes (Signed)
Patient ID: Alphonzo Devera, male   DOB: 08-27-1995, 23 y.o.   MRN: [id number]    [place] NOVEL CORONAVIRUS (COVID-19) DAILY CHECK-OFF SYMPTOMS - answer yes or no to each - every day NO YES  Have you had a fever in the past 24 hours?  . Fever (Temp > 37.80C / 100F) X   Have you had any of these symptoms in the past 24 hours? . New Cough .  Sore Throat  .  Shortness of Breath .  Difficulty Breathing .  Unexplained Body Aches   X   Have you had any one of these symptoms in the past 24 hours not related to allergies?   . Runny Nose .  Nasal Congestion .  Sneezing   X   If you have had runny nose, nasal congestion, sneezing in the past 24 hours, has it worsened?  X   EXPOSURES - check yes or no X   Have you traveled outside the state in the past 14 days?  X   Have you been in contact with someone with a confirmed diagnosis of COVID-19 or PUI in the past 14 days without wearing appropriate PPE?  X   Have you been living in the same home as a person with confirmed diagnosis of COVID-19 or a PUI (household contact)?    X   Have you been diagnosed with COVID-19?    X              What to do next: Answered NO to all: Answered YES to anything:   Proceed with unit schedule Follow the [facility] Inpatient Flowsheet.
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
## 2019-06-07 MED ORDER — OLANZAPINE 7.5 MG PO TABS
15.0000 mg | ORAL_TABLET | Freq: Every day | ORAL | Status: DC
  Administered 2019-06-07: 21:00:00 15 mg via ORAL
  Filled 2019-06-07 (×3): qty 2

## 2019-06-07 MED ORDER — LORATADINE 10 MG PO TABS
10.0000 mg | ORAL_TABLET | Freq: Every day | ORAL | Status: DC
  Administered 2019-06-07 – 2019-06-09 (×3): 10 mg via ORAL
  Filled 2019-06-07 (×6): qty 1

## 2019-06-07 MED ORDER — OMEGA-3-ACID ETHYL ESTERS 1 G PO CAPS
1.0000 g | ORAL_CAPSULE | Freq: Two times a day (BID) | ORAL | Status: DC
  Administered 2019-06-07 – 2019-06-09 (×5): 1 g via ORAL
  Filled 2019-06-07 (×9): qty 1

## 2019-06-07 MED ORDER — TEMAZEPAM 30 MG PO CAPS
30.0000 mg | ORAL_CAPSULE | Freq: Every day | ORAL | Status: DC
  Administered 2019-06-07: 30 mg via ORAL
  Filled 2019-06-07: qty 1

## 2019-06-07 NOTE — Progress Notes (Signed)
[facility]  06/07/2019 8:23 AM Joshua Mahoney  MRN:  [id number] Subjective:    Patient required admission on 11/27 for new onset hallucinations and delusional statements.  There were also hyper religious and bizarre statements as well.  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  Very focused on discharge.  He denies hallucinations. According to the assessment team note the symptoms have been present for several weeks and his drug screen is positive for cannabis. 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 Polysubstance abuse (HCC)    History reviewed. No pertinent surgical history. Family History: History reviewed. No pertinent family history. Family Psychiatric  History: Denied Social History:  Social History   Substance and Sexual Activity  Alcohol Use Yes   Comment: bottle of wine last night     Social History   Substance and Sexual Activity  Drug Use No   Comment: heroin, xanax last use years ago    Social History   Socioeconomic History  . Marital status: Single    Spouse name: Not on file  . Number of children: Not on file  . Years of education: Not on file  . Highest education level: Not on file  Occupational History  . Not on file  Social Needs  . Financial resource strain: Patient refused  . Food insecurity    Worry: Patient refused    Inability: Patient refused  . Transportation  needs    Medical: Patient refused    Non-medical: Patient refused  Tobacco Use  . Smoking status: Current Every Day Smoker    Packs/day: 1.00    Types: Cigarettes  . Smokeless tobacco: Never Used  Substance and Sexual Activity  . Alcohol use: Yes    Comment: bottle of wine last night  . Drug use: No    Comment: heroin, xanax last use years ago  . Sexual activity: Not on file  Lifestyle  . Physical activity    Days per week: Patient refused    Minutes per session: Patient refused  . Stress: Not on file  Relationships  . Social Musicianconnections    Talks on phone: Patient refused    Gets together: Patient refused    Attends religious service: Patient refused    Active member of club or organization: Patient refused    Attends meetings of clubs or organizations: Patient refused    Relationship status: Patient refused  Other Topics Concern  . Not on file  Social History Narrative  . Not on file   Additional Social History:                         Sleep: Charted at 2 hours  Appetite:  Fair  Current Medications: Current Facility-Administered Medications  Medication Dose Route Frequency Provider Last Rate Last Dose  . acetaminophen (TYLENOL) tablet 650 mg  650 mg Oral Q6H PRN Maryagnes AmosStarkes-Perry, Takia S, FNP   650 mg  at 06/06/19 0156  . alum & mag hydroxide-simeth (MAALOX/MYLANTA) 200-200-20 MG/5ML suspension 30 mL  30 mL Oral Q4H PRN Burt Ek, Gayland Curry, FNP      . hydrOXYzine (ATARAX/VISTARIL) tablet 25 mg  25 mg Oral TID PRN Connye Burkitt, NP   25 mg at 06/07/19 0321  . OLANZapine zydis (ZYPREXA) disintegrating tablet 10 mg  10 mg Oral Q8H PRN Cobos, Myer Peer, MD   10 mg at 06/06/19 0815   And  . LORazepam (ATIVAN) tablet 1 mg  1 mg Oral PRN Cobos, Myer Peer, MD       And  . ziprasidone (GEODON) injection 20 mg  20 mg Intramuscular PRN Cobos, Myer Peer, MD      . LORazepam (ATIVAN) tablet 1 mg  1 mg Oral Q6H PRN Cobos, Myer Peer, MD   1 mg at 06/06/19 2355  .  magnesium hydroxide (MILK OF MAGNESIA) suspension 30 mL  30 mL Oral Daily PRN Suella Broad, FNP      . multivitamin with minerals tablet 1 tablet  1 tablet Oral Daily Cobos, Myer Peer, MD   1 tablet at 06/07/19 0755  . nicotine (NICODERM CQ - dosed in mg/24 hours) patch 21 mg  21 mg Transdermal Daily Johnn Hai, MD   21 mg at 06/07/19 [phone]  . OLANZapine (ZYPREXA) tablet 15 mg  15 mg Oral QHS Johnn Hai, MD      . omega-3 acid ethyl esters (LOVAZA) capsule 1 g  1 g Oral BID Johnn Hai, MD      . thiamine (VITAMIN B-1) tablet 100 mg  100 mg Oral Daily Cobos, Myer Peer, MD   100 mg at 06/07/19 0755  . traZODone (DESYREL) tablet 50 mg  50 mg Oral QHS PRN Connye Burkitt, NP   50 mg at 06/06/19 2121    Lab Results: No results found for this or any previous visit (from the past 48 hour(s)).  Blood Alcohol level:  Lab Results  Component Value Date   ETH <10 17/51/0258    Metabolic Disorder Labs: No results found for: HGBA1C, MPG No results found for: PROLACTIN No results found for: CHOL, TRIG, HDL, CHOLHDL, VLDL, LDLCALC  Physical Findings: AIMS: Facial and Oral Movements Muscles of Facial Expression: None, normal Lips and Perioral Area: None, normal Jaw: None, normal Tongue: None, normal,Extremity Movements Upper (arms, wrists, hands, fingers): None, normal Lower (legs, knees, ankles, toes): None, normal, Trunk Movements Neck, shoulders, hips: None, normal, Overall Severity Severity of abnormal movements (highest score from questions above): None, normal Incapacitation due to abnormal movements: None, normal Patient's awareness of abnormal movements (rate only patient's report): No Awareness, Dental Status Current problems with teeth and/or dentures?: No Does patient usually wear dentures?: No  CIWA:  CIWA-Ar Total: 0 COWS:     Musculoskeletal: Strength & Muscle Tone: within normal limits Gait & Station: normal Patient leans: N/A  Psychiatric Specialty  Exam: Physical Exam  ROS  Blood pressure 109/76, pulse 85, temperature (!) 97.3 F (36.3 C), temperature source Oral, resp. rate 18.There is no height or weight on file to calculate BMI.  General Appearance: Casual  Eye Contact:  Fair  Speech:  Clear and Coherent  Volume:  Normal  Mood:  Euthymic  Affect:  Congruent  Thought Process:  Coherent, Goal Directed and Descriptions of Associations: Circumstantial  Orientation:  Full (Time, Place, and Person)  Thought Content:  Logical and Denies hallucinations and previously expressed material  Suicidal Thoughts:  No  Homicidal  Thoughts:  No  Memory:  Immediate;   Fair Recent;   Fair Remote;   Fair  Judgement:  Fair  Insight:  Fair  Psychomotor Activity:  Normal  Concentration:  Concentration: Good and Attention Span: Fair  Recall:  [employer] of Knowledge:  Fair  Language:  Fair  Akathisia:  Negative  Handed:  Right  AIMS (if indicated):     Assets:  Communication Skills Desire for Improvement  ADL's:  Intact  Cognition:  WNL  Sleep:  Number of Hours: 2     Treatment Plan Summary: Daily contact with patient to assess and evaluate symptoms and progress in treatment and Medication management  Patient still not sleeping well he blames all of his problems on insomnia however 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

## 2019-06-07 NOTE — Tx Team (Signed)
Interdisciplinary Treatment and Diagnostic Plan Update  06/07/2019 Time of Session: 10:00am Joshua Mahoney MRN: [id number]  Principal Diagnosis: <principal problem not specified>  Secondary Diagnoses: Active Problems:   Psychosis ([place])   Current Medications:  Current Facility-Administered Medications  Medication Dose Route Frequency Provider Last Rate Last Dose  . acetaminophen (TYLENOL) tablet 650 mg  650 mg Oral Q6H PRN Suella Broad, FNP   650 mg at 06/06/19 0156  . alum & mag hydroxide-simeth (MAALOX/MYLANTA) 200-200-20 MG/5ML suspension 30 mL  30 mL Oral Q4H PRN Burt Ek, Gayland Curry, FNP      . hydrOXYzine (ATARAX/VISTARIL) tablet 25 mg  25 mg Oral TID PRN Connye Burkitt, NP   25 mg at 06/07/19 0321  . OLANZapine zydis (ZYPREXA) disintegrating tablet 10 mg  10 mg Oral Q8H PRN Cobos, Myer Peer, MD   10 mg at 06/06/19 0815   And  . LORazepam (ATIVAN) tablet 1 mg  1 mg Oral PRN Cobos, Myer Peer, MD       And  . ziprasidone (GEODON) injection 20 mg  20 mg Intramuscular PRN Cobos, Myer Peer, MD      . LORazepam (ATIVAN) tablet 1 mg  1 mg Oral Q6H PRN Cobos, Myer Peer, MD   1 mg at 06/06/19 2355  . magnesium hydroxide (MILK OF MAGNESIA) suspension 30 mL  30 mL Oral Daily PRN Suella Broad, FNP      . multivitamin with minerals tablet 1 tablet  1 tablet Oral Daily Cobos, Myer Peer, MD   1 tablet at 06/07/19 0755  . nicotine (NICODERM CQ - dosed in mg/24 hours) patch 21 mg  21 mg Transdermal Daily Johnn Hai, MD   21 mg at 06/07/19 [phone]  . OLANZapine (ZYPREXA) tablet 15 mg  15 mg Oral QHS Johnn Hai, MD      . omega-3 acid ethyl esters (LOVAZA) capsule 1 g  1 g Oral BID Johnn Hai, MD      . temazepam (RESTORIL) capsule 30 mg  30 mg Oral QHS Johnn Hai, MD      . thiamine (VITAMIN B-1) tablet 100 mg  100 mg Oral Daily Cobos, Myer Peer, MD   100 mg at 06/07/19 0755  . traZODone (DESYREL) tablet 50 mg  50 mg Oral QHS PRN Connye Burkitt, NP   50 mg at 06/06/19  2121   PTA Medications: Medications Prior to Admission  Medication Sig Dispense Refill Last Dose  . zolpidem (AMBIEN CR) 6.25 MG CR tablet Take 6.25 mg by mouth at bedtime as needed for sleep.    Unknown at Unknown time    Patient Stressors:    Patient Strengths:    Treatment Modalities: Medication Management, Group therapy, Case management,  1 to 1 session with clinician, Psychoeducation, Recreational therapy.   Physician Treatment Plan for Primary Diagnosis: <principal problem not specified> Long Term Goal(s): Improvement in symptoms so as ready for discharge Improvement in symptoms so as ready for discharge   Short Term Goals: Ability to identify changes in lifestyle to reduce recurrence of condition will improve Ability to verbalize feelings will improve Ability to disclose and discuss suicidal ideas Ability to demonstrate self-control will improve Ability to identify and develop effective coping behaviors will improve  Medication Management: Evaluate patient's response, side effects, and tolerance of medication regimen.  Therapeutic Interventions: 1 to 1 sessions, Unit Group sessions and Medication administration.  Evaluation of Outcomes: Not Met  Physician Treatment Plan for Secondary Diagnosis: Active Problems:   Psychosis (  [place])  Long Term Goal(s): Improvement in symptoms so as ready for discharge Improvement in symptoms so as ready for discharge   Short Term Goals: Ability to identify changes in lifestyle to reduce recurrence of condition will improve Ability to verbalize feelings will improve Ability to disclose and discuss suicidal ideas Ability to demonstrate self-control will improve Ability to identify and develop effective coping behaviors will improve     Medication Management: Evaluate patient's response, side effects, and tolerance of medication regimen.  Therapeutic Interventions: 1 to 1 sessions, Unit Group sessions and Medication  administration.  Evaluation of Outcomes: Not Met   RN Treatment Plan for Primary Diagnosis: <principal problem not specified> Long Term Goal(s): Knowledge of disease and therapeutic regimen to maintain health will improve  Short Term Goals: Ability to verbalize feelings will improve, Ability to identify and develop effective coping behaviors will improve and Compliance with prescribed medications will improve  Medication Management: RN will administer medications as ordered by provider, will assess and evaluate patient's response and provide education to patient for prescribed medication. RN will report any adverse and/or side effects to prescribing provider.  Therapeutic Interventions: 1 on 1 counseling sessions, Psychoeducation, Medication administration, Evaluate responses to treatment, Monitor vital signs and CBGs as ordered, Perform/monitor CIWA, COWS, AIMS and Fall Risk screenings as ordered, Perform wound care treatments as ordered.  Evaluation of Outcomes: Not Met   LCSW Treatment Plan for Primary Diagnosis: <principal problem not specified> Long Term Goal(s): Safe transition to appropriate next level of care at discharge, Engage patient in therapeutic group addressing interpersonal concerns.  Short Term Goals: Engage patient in aftercare planning with referrals and resources, Increase social support, Increase emotional regulation, Identify triggers associated with mental health/substance abuse issues and Increase skills for wellness and recovery  Therapeutic Interventions: Assess for all discharge needs, 1 to 1 time with Social worker, Explore available resources and support systems, Assess for adequacy in community support network, Educate family and significant other(s) on suicide prevention, Complete Psychosocial Assessment, Interpersonal group therapy.  Evaluation of Outcomes: Not Met   Progress in Treatment: Attending groups: Yes. Participating in groups: Yes. Taking  medication as prescribed: Yes. Toleration medication: Yes. Family/Significant other contact made: No, will contact:  Mother, Lenell Antu. Patient understands diagnosis: Yes. Discussing patient identified problems/goals with staff: Yes. Medical problems stabilized or resolved: No. Denies suicidal/homicidal ideation: Yes. Issues/concerns per patient self-inventory: Yes.  New problem(s) identified: Yes, Describe:  limited social supports, financial stressors  New Short Term/Long Term Goal(s): medication management for mood stabilization; elimination of SI thoughts; development of comprehensive mental wellness/sobriety plan.  Patient Goals:  Wants medication stabilization.  Discharge Plan or Barriers: CSW assessing for appropriate referrals.  Reason for Continuation of Hospitalization: Anxiety Delusions  Hallucinations Medication stabilization  Estimated Length of Stay: 3-5 days  Attendees: Patient: Joshua Mahoney 06/07/2019 10:06 AM  Physician: Dr.Farah 06/07/2019 10:06 AM  Nursing: Benjamine Mola, RN 06/07/2019 10:06 AM  RN Care Manager: 06/07/2019 10:06 AM  Social Worker: Stephanie Acre, [place] 06/07/2019 10:06 AM  Recreational Therapist:  06/07/2019 10:06 AM  Other:  06/07/2019 10:06 AM  Other:  06/07/2019 10:06 AM  Other: 06/07/2019 10:06 AM    Scribe for Treatment Team: Joellen Jersey, [place] 06/07/2019 10:06 AM

## 2019-06-07 NOTE — Progress Notes (Signed)
Adult Psychoeducational Group Note  Date:  06/07/2019 Time:  1:42 AM  Group Topic/Focus:  Wrap-Up Group:   The focus of this group is to help patients review their daily goal of treatment and discuss progress on daily workbooks.  Participation Level:  Active  Participation Quality:  Appropriate  Affect:  Appropriate  Cognitive:  Appropriate  Insight: Appropriate  Engagement in Group:  Developing/Improving  Modes of Intervention:  Discussion  Additional Comments:  Pt stated his goal for today was to focus on his treatment plan. Pt stated he felt he accomplished his goal today. Pt stated his relationship with his family has improved since he was admitted here. Pt stated he was able to contact his mother today, which improved his day.  Pt stated he felt better about himself today. Pt rated his overall day a 10. Pt stated his appetite has pretty good today. Pt stated his goal for tonight was to get some rest. Pt stated he was in no physical pain today. Pt stated he was not hearing are seeing anythings that was not there today.Pt stated he had no thoughts of harming himself or others today. Pt state if anything change he would alert staff.    [name] 06/07/2019, 1:42 AM

## 2019-06-07 NOTE — Progress Notes (Signed)
Recreation Therapy Notes  INPATIENT RECREATION THERAPY ASSESSMENT  Patient Details Name: Joshua Mahoney MRN: [id number] DOB: 1996/02/18 Today's Date: 06/07/2019       Information Obtained From: Patient  Able to Participate in Assessment/Interview: Yes  Patient Presentation: Alert  Reason for Admission (Per Patient): Other (Comments)(Overwhelmed during the holidays)  Patient Stressors: Other (Comment)(Finances; Finding a partner; Trying to stay sober)  Coping Skills:   Isolation, Journal, Sports, Arguments, Aggression, Music, Exercise, Meditate, Deep Breathing, Impulsivity, Talk, Art, Prayer, Avoidance, Intrusive Behavior, Read, Hot Bath/Shower  Leisure Interests (2+):  Exercise - Lifting Weights, Individual - Other (Comment), Nature - Other (Comment)(Ride around the farm; Pray; Sit outside)  Frequency of Recreation/Participation: Other (Comment)(Weights, Ride around farm; [place]- Daily; Sit outside- Never)  Awareness of Community Resources:  Yes  Community Resources:  Restaurants, Engineer, building services  Current Use: Yes  If no, Barriers?:    Expressed Interest in [place]: No  County of Residence:  [place]  Patient Main Form of Transportation: Musician  Patient Strengths:  Knowledge; [place]  Patient Identified Areas of Improvement:  Passive aggressive; Respect  Patient Goal for Hospitalization:  "get diagnosed, get better"  Current SI (including self-harm):  No  Current HI:  No  Current AVH: No  Staff Intervention Plan: Group Attendance, Collaborate with Interdisciplinary Treatment Team  Consent to Intern Participation: N/A    Victorino Sparrow, LRT/CTRS  Victorino Sparrow A 06/07/2019, 12:14 PM

## 2019-06-07 NOTE — Progress Notes (Signed)
Patient has been up on 4 occassions asking to use the phone, coffee, talking about the end times and being afraid. He received his scheduled medication along with trazodone and later was given ativan but he is not resting. Encouraged to try and get his rest. Patient came back up during the time writer is documenting and he is talking about a book he published called the story of my life but it is actually the bible. Talking about his needing his Ipad and being angry with his mom because she won't tell him what's going on. Writer listened to him and encouraged him to try and rest.

## 2019-06-07 NOTE — Progress Notes (Signed)
CSW spoke with patient on unit at patient request. Patient is focused on discharge, he expressed that he has experienced difficulty sleeping while at [facility] and wants to return home to get a solid night of sleep prior to his court dates scheduled for 12/02 (Wednesday) in [place].  CSW attempted to call mother for collateral information, left voicemail.  Stephanie Acre, MSW, [place] Social Worker [facility] Adult Unit  [phone]

## 2019-06-07 NOTE — Progress Notes (Signed)
   06/07/19 2300  Psych Admission Type (Psych Patients Only)  Admission Status Involuntary  Psychosocial Assessment  Patient Complaints Anxiety  Eye Contact Brief  Facial Expression Anxious  Affect Labile  Speech Logical/coherent;Argumentative;Tangential  Interaction Assertive  Motor Activity Pacing  Appearance/Hygiene Unremarkable  Behavior Characteristics Anxious  Mood Anxious;Suspicious;Preoccupied  Thought Community education officer  Content Paranoia  Delusions Paranoid  Perception Hallucinations  Hallucination Visual  Judgment Impaired  Confusion Severe  Danger to Self  Current suicidal ideation? Denies  Danger to Others  Danger to Others None reported or observed

## 2019-06-07 NOTE — [facility] Suicide Risk Assessment (Signed)
Joshua Mahoney INPATIENT:  Family/Significant Other Suicide Prevention Education  Suicide Prevention Education:  Education Completed; Joshua Mahoney, mother, [phone] has been identified by the patient as the family member/significant other with whom the patient will be residing, and identified as the person(s) who will aid the patient in the event of a mental health crisis (suicidal ideations/suicide attempt).  With written consent from the patient, the family member/significant other has been provided the following suicide prevention education, prior to the and/or following the discharge of the patient.  The suicide prevention education provided includes the following:  Suicide risk factors  Suicide prevention and interventions  National Suicide Hotline telephone number  [facility] assessment telephone number  [facility] Emergency Assistance [place] and/or Residential Mobile Crisis Unit telephone number  Request made of family/significant other to:  Remove weapons (e.g., guns, rifles, knives), all items previously/currently identified as safety concern.    Remove drugs/medications (over-the-counter, prescriptions, illicit drugs), all items previously/currently identified as a safety concern.  The family member/significant other verbalizes understanding of the suicide prevention education information provided.  The family member/significant other agrees to remove the items of safety concern listed above.  Mother, Joshua Mahoney, was very appreciative of call. Mother answered the call while driving to meet with patient's attorney regarding patient's [facility] court date on 12/02. Mother states patient's attorney works for [employer] at [phone].  Mother reports that patient has not slept in the last 15 days, she reports keeping a close eye on him, so she knows he has not been sleeping. Further, this patient went to an urgent care clinic 7 days ago for  his insomnia. Mother reports that patient was prescribed generic Ambien "and he has not been right since," per mother, after starting the Ambien, patient still has not slept and began hallucinating. Mother states that patient also started taking "bottles of CBD," after starting the Ambien to try to sleep.   Mother reports patient sounds "full of energy," and not stabilized when he has called from [facility]. She wants to ensure that he is on medication and is able to sleep prior to discharge. She asked for a call from psychiatry.   Joellen Jersey 06/07/2019, 3:01 PM

## 2019-06-07 NOTE — [facility] Suicide Risk Assessment (Signed)
[place] INPATIENT:  Family/Significant Other Suicide Prevention Education  Suicide Prevention Education:  Contact Attempts: Morris Longenecker, mother, [phone]  has been identified by the patient as the family member/significant other with whom the patient will be residing, and identified as the person(s) who will aid the patient in the event of a mental health crisis.  With written consent from the patient, two attempts were made to provide suicide prevention education, prior to and/or following the patient's discharge.  We were unsuccessful in providing suicide prevention education.  A suicide education pamphlet was given to the patient to share with family/significant other.  Date and time of first attempt: 06/07/2019 at 1:45pm. CSW left a HIPAA compliant voicemail with detailed callback information. Date and time of second attempt: To be attempted at a later time.  Joellen Jersey 06/07/2019, 1:47 PM

## 2019-06-07 NOTE — Progress Notes (Signed)
Recreation Therapy Notes  Date: 11.30.20 Time: 1000 Location: 500 Hall Dayroom  Group Topic: Wellness  Goal Area(s) Addresses:  Patient will define components of whole wellness. Patient will verbalize benefit of whole wellness.  Behavioral Response: Engaged  Intervention:  Music  Activity: Exercise.  LRT led patients in a series of stretches to get them loosened up for group.  Each patient gets the chance to lead the group in an exercise of their choosing.  The group is to complete at least 30 minutes of exercise.  Patients could take breaks or get water as needed.  Education: Wellness, Dentist.   Education Outcome: Acknowledges education/In group clarification offered/Needs additional education.   Clinical Observations/Feedback:  Pt was active and pleasant throughout activity.  Pt lead group in push ups during each of his turns.  Pt was engaged and attentive to his peers as they presented their exercises as well.   Victorino Sparrow, LRT/CTRS     Victorino Sparrow A 06/07/2019 11:29 AM

## 2019-06-07 NOTE — Progress Notes (Signed)
Pt up to the nursing station requesting  a nicotine patch. Pt was encouraged to remove the one he had on earlier to help him sleep. Pt asking for various things. Pt stated he can not go to sleep, but pt fell asleep standing up at the nursing station. Pt given PRN Ativan per MAR and instructed to lay down .

## 2019-06-07 NOTE — BH Assessment (Addendum)
Pt's mother, Gerrad Welker [phone] requesting update on her son. Mother was advised message would be forwarded if pt was admitted.  Addendum: Noted in chart that collateral with mother had been attempted. Attempted to return call to mother, but no answer.

## 2019-06-08 MED ORDER — TEMAZEPAM 15 MG PO CAPS
45.0000 mg | ORAL_CAPSULE | Freq: Every day | ORAL | Status: DC
  Administered 2019-06-08: 21:00:00 45 mg via ORAL
  Filled 2019-06-08: qty 3

## 2019-06-08 MED ORDER — ZIPRASIDONE MESYLATE 20 MG IM SOLR
20.0000 mg | Freq: Once | INTRAMUSCULAR | Status: DC
  Filled 2019-06-08: qty 20

## 2019-06-08 MED ORDER — LORAZEPAM 2 MG/ML IJ SOLN: 4.0000 mg | Freq: Once | INTRAMUSCULAR | Status: DC

## 2019-06-08 MED ORDER — LORAZEPAM 2 MG/ML IJ SOLN
INTRAMUSCULAR | Status: AC
  Administered 2019-06-08: 15:00:00
  Filled 2019-06-08: qty 2

## 2019-06-08 MED ORDER — ARIPIPRAZOLE 2 MG PO TABS
2.0000 mg | ORAL_TABLET | Freq: Once | ORAL | Status: AC
  Administered 2019-06-08: 14:00:00 2 mg via ORAL
  Filled 2019-06-08: qty 1

## 2019-06-08 MED ORDER — OLANZAPINE 10 MG PO TABS
20.0000 mg | ORAL_TABLET | Freq: Every day | ORAL | Status: DC
  Administered 2019-06-08: 20 mg via ORAL
  Filled 2019-06-08 (×3): qty 2

## 2019-06-08 MED ORDER — LORAZEPAM 2 MG/ML IJ SOLN
4.0000 mg | Freq: Once | INTRAMUSCULAR | Status: AC
  Administered 2019-06-08: 15:00:00 4 mg via INTRAMUSCULAR

## 2019-06-08 NOTE — Progress Notes (Signed)
Adult Psychoeducational Group Note  Date:  06/08/2019 Time:  2:26 AM  Group Topic/Focus:  Wrap-Up Group:   The focus of this group is to help patients review their daily goal of treatment and discuss progress on daily workbooks.  Participation Level:  Minimal  Participation Quality:  Appropriate  Affect:  Appropriate  Cognitive:  Disorganized and Confused  Insight: Limited  Engagement in Group:  Limited  Modes of Intervention:  Discussion  Additional Comments: Pt stated his goal for today was to focus on his treatment plan. Pt stated he felt he accomplished his goal today. Pt stated his relationship with his familyhas improved since he was admitted here.Pt stated he was able to contact his mother and brother today, which improved his day.Pt stated he felt better about himself today. Pt rated his overall day a 10. Pt stated his appetite has pretty good today. Pt stated his goal for tonight was to get some rest. Pt stated he was in no physical pain today. Pt stated he was nothearing are seeing anythingsthat was not there today.Pt stated he had no thoughts of harming himself or others today. Pt state if anything change he would alert staff.    [name] 06/08/2019, 2:26 AM

## 2019-06-08 NOTE — Progress Notes (Signed)
   06/08/19 2300  Psych Admission Type (Psych Patients Only)  Admission Status Involuntary  Psychosocial Assessment  Patient Complaints Anxiety  Eye Contact Brief  Facial Expression Anxious  Affect Labile  Speech Logical/coherent;Argumentative;Tangential  Interaction Assertive  Motor Activity Pacing  Appearance/Hygiene Unremarkable  Behavior Characteristics Anxious  Mood Suspicious;Anxious  Thought Process  Coherency Tangential  Content Paranoia  Delusions Paranoid  Perception Hallucinations  Hallucination Visual  Judgment Impaired  Confusion Severe  Danger to Self  Current suicidal ideation? Denies  Danger to Others  Danger to Others None reported or observed   Pt continues to be paranoid this evening, but pt has been appropriate .

## 2019-06-08 NOTE — Progress Notes (Signed)
[facility] MD Progress Note  06/08/2019 9:27 AM Joshua Mahoney  MRN:  [id number] Subjective:   Patient lobbies incessantly for discharge, he is resistant to long-acting injectable insisting he must go to court tomorrow and he does indeed have a court case according to our records.  He is alert oriented generally holding it together not medically irritable but insistent upon discharge and insisting that we are holding him against his will of course he is under commitment and I explained to him that I need to talk with his mother, I explained to him that the severity of his symptoms were such that we want to make sure that there is no acute dangerousness when he leaves so forth but at this point he cannot be bumped off of his demand for discharge. 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 Polysubstance abuse (HCC)    History reviewed. No pertinent surgical history. Family History: History reviewed. No pertinent family history. Family Psychiatric  History: No new data Social History:  Social History   Substance and Sexual Activity  Alcohol Use Yes   Comment: bottle of wine last night     Social History   Substance and Sexual Activity  Drug Use No   Comment: heroin, xanax last use years ago    Social History   Socioeconomic History  . Marital status: Single    Spouse name: Not on file  . Number of children: Not on file  . Years of education: Not on file  . Highest education level: Not on file  Occupational History  . Not on file  Social Needs  . Financial resource strain: Patient refused  . Food insecurity    Worry: Patient refused    Inability: Patient refused  . Transportation needs     Medical: Patient refused    Non-medical: Patient refused  Tobacco Use  . Smoking status: Current Every Day Smoker    Packs/day: 1.00    Types: Cigarettes  . Smokeless tobacco: Never Used  Substance and Sexual Activity  . Alcohol use: Yes    Comment: bottle of wine last night  . Drug use: No    Comment: heroin, xanax last use years ago  . Sexual activity: Not on file  Lifestyle  . Physical activity    Days per week: Patient refused    Minutes per session: Patient refused  . Stress: Not on file  Relationships  . Social Musician on phone: Patient refused    Gets together: Patient refused    Attends religious service: Patient refused    Active member of club or organization: Patient refused    Attends meetings of clubs or organizations: Patient refused    Relationship status: Patient refused  Other Topics Concern  . Not on file  Social History Narrative  . Not on file   Additional Social History:                         Sleep: Fair  Appetite:  Fair  Current Medications: Current Facility-Administered Medications  Medication Dose Route Frequency Provider Last Rate Last Dose  . acetaminophen (TYLENOL) tablet 650 mg  650 mg Oral Q6H PRN Caryn Bee  S, FNP   650 mg at 06/06/19 0156  . alum & mag hydroxide-simeth (MAALOX/MYLANTA) 200-200-20 MG/5ML suspension 30 mL  30 mL Oral Q4H PRN Rosario AdieStarkes-Perry, Juel Burrowakia S, FNP      . hydrOXYzine (ATARAX/VISTARIL) tablet 25 mg  25 mg Oral TID PRN Aldean BakerSykes, Janet E, NP   25 mg at 06/08/19 0408  . loratadine (CLARITIN) tablet 10 mg  10 mg Oral Daily Malvin JohnsFarah, Alexiz Sustaita, MD   10 mg at 06/08/19 0802  . LORazepam (ATIVAN) tablet 1 mg  1 mg Oral Q6H PRN Cobos, Rockey SituFernando A, MD   1 mg at 06/07/19 2323  . magnesium hydroxide (MILK OF MAGNESIA) suspension 30 mL  30 mL Oral Daily PRN Maryagnes AmosStarkes-Perry, Takia S, FNP      . multivitamin with minerals tablet 1 tablet  1 tablet Oral Daily Cobos, Rockey SituFernando A, MD   1 tablet at 06/08/19 0802  .  nicotine (NICODERM CQ - dosed in mg/24 hours) patch 21 mg  21 mg Transdermal Daily Malvin JohnsFarah, Mabry Tift, MD   21 mg at 06/08/19 0802  . OLANZapine (ZYPREXA) tablet 15 mg  15 mg Oral QHS Malvin JohnsFarah, Bartolo Montanye, MD   15 mg at 06/07/19 2109  . OLANZapine zydis (ZYPREXA) disintegrating tablet 10 mg  10 mg Oral Q8H PRN Cobos, Rockey SituFernando A, MD   10 mg at 06/06/19 0815   And  . ziprasidone (GEODON) injection 20 mg  20 mg Intramuscular PRN Cobos, Rockey SituFernando A, MD      . omega-3 acid ethyl esters (LOVAZA) capsule 1 g  1 g Oral BID Malvin JohnsFarah, Amahia Madonia, MD   1 g at 06/08/19 0802  . temazepam (RESTORIL) capsule 30 mg  30 mg Oral QHS Malvin JohnsFarah, Ziquan Fidel, MD   30 mg at 06/07/19 2109  . thiamine (VITAMIN B-1) tablet 100 mg  100 mg Oral Daily Cobos, Rockey SituFernando A, MD   100 mg at 06/08/19 0802  . traZODone (DESYREL) tablet 50 mg  50 mg Oral QHS PRN Aldean BakerSykes, Janet E, NP   50 mg at 06/07/19 2109    Lab Results: No results found for this or any previous visit (from the past 48 hour(s)).  Blood Alcohol level:  Lab Results  Component Value Date   ETH <10 06/04/2019    Metabolic Disorder Labs: No results found for: HGBA1C, MPG No results found for: PROLACTIN No results found for: CHOL, TRIG, HDL, CHOLHDL, VLDL, LDLCALC  Physical Findings: AIMS: Facial and Oral Movements Muscles of Facial Expression: None, normal Lips and Perioral Area: None, normal Jaw: None, normal Tongue: None, normal,Extremity Movements Upper (arms, wrists, hands, fingers): None, normal Lower (legs, knees, ankles, toes): None, normal, Trunk Movements Neck, shoulders, hips: None, normal, Overall Severity Severity of abnormal movements (highest score from questions above): None, normal Incapacitation due to abnormal movements: None, normal Patient's awareness of abnormal movements (rate only patient's report): No Awareness, Dental Status Current problems with teeth and/or dentures?: No Does patient usually wear dentures?: No  CIWA:  CIWA-Ar Total: 3 COWS:      Musculoskeletal: Strength & Muscle Tone: within normal limits Gait & Station: normal Patient leans: N/A  Psychiatric Specialty Exam: Physical Exam  ROS  Blood pressure 120/87, pulse 84, temperature (!) 97.3 F (36.3 C), temperature source Oral, resp. rate 18.There is no height or weight on file to calculate BMI.  General Appearance: Casual  Eye Contact:  Good  Speech:  Clear and Coherent  Volume:  Normal  Mood:  Anxious  Affect:  Appropriate  Thought Process:  Coherent, Linear  and Descriptions of Associations: Circumstantial  Orientation:  Full (Time, Place, and Person)  Thought Content:  Denies hallucinations singular focus is discharge  Suicidal Thoughts:  No  Homicidal Thoughts:  No  Memory:  Immediate;   Fair Recent;   Fair  Judgement:  Fair  Insight:  Fair  Psychomotor Activity:  Normal  Concentration:  Concentration: Fair and Attention Span: Fair  Recall:  [employer] of Knowledge:  Fair  Language:  Fair  Akathisia:  Negative  Handed:  Right  AIMS (if indicated):     Assets:  Leisure Time Physical Health  ADL's:  Intact  Cognition:  WNL  Sleep:  Number of Hours: 2     Treatment Plan Summary: Daily contact with patient to assess and evaluate symptoms and progress in treatment and Medication management  Patient did not sleep last night escalate Sleep Aid continue to try and reach mother continue to request long-acting injectable may be discharged within 24 hours again does have court tomorrow  Johnn Hai, MD 06/08/2019, 9:27 AM

## 2019-06-08 NOTE — Progress Notes (Signed)
Patient has been making statements like "I am not going into that room, people keep on laughing at me"  Patient was in the room with peers while watching TV and no one was laughing.  Patient went into his room and ripped the bench from the all exposing metal screws on the floor.  Code STARR was called and patient was given medications 4 of ativan and 20 of geodon without incident.  Patient stated "I had a mental breakdown, people were laughing at me so I got angry and I felt like ripping some shit up."  Offered medications as prescribed, engage patient in 1:1 staff talks, assess patient for safety.  Patient able to maintain safety, with staff support.

## 2019-06-08 NOTE — BHH Counselor (Signed)
CSW received a call from [facility], mother, [phone] . Mother expresses concerns regarding a potential discharge tomorrow or shortly afterward.  Mother shares that this patient's father was previously hospitalized at [facility] for a period of 12 days, "after that he was good, he was calm. Not Joshua Mahoney." Mother expressed concerns that patient is still not able to sleep throughout the night and he has called her repeatedly asking her to bring him a coat and/or pick up him. Mother states that the patient has called her approximately 20 times this morning, starting at [place]. She does not feel that he is ready to discharge.  Mother also requested documentation for patient's attorney, Ronnell Freshwater, as patient has court in [facility] tomorrow morning.  Stephanie Acre, MSW, [place] Social Worker [facility] Adult Unit  [phone]

## 2019-06-08 NOTE — Progress Notes (Signed)
Pt up in his room trying to organize papers, pt took a shower, pt visibly sleepy, but trying to do anything to stay awake. Pt encouraged to lay down and try to get some sleep

## 2019-06-08 NOTE — Progress Notes (Signed)
Recreation Therapy Notes  Date: 12.1.20 Time: 1000 Location: 500 Hall Dayroom  Group Topic: Coping Skills  Goal Area(s) Addresses:  Patient will identify positive coping skills. Patient will identify benefit of using coping skills post d/c.  Behavioral Response: Engaged  Intervention: Worksheet, pencils  Activity: Mind Map.  LRT and patients filled in the first 8 boxes of the mind map with being honest, anxiety, loss of job, having kids, school, work, depression and losing someone.  Patients were then given time to come up with at least 3 coping skills for each situation identified.   Education: Radiographer, therapeutic, Dentist.   Education Outcome: Acknowledges understanding/In group clarification offered/Needs additional education.   Clinical Observations/Feedback: Pt was a little anxious but able to participate in group.  Pt identified coping skills as red letters, act better, be yourself and set the example.    Victorino Sparrow, LRT/CTRS      Victorino Sparrow A 06/08/2019 11:36 AM

## 2019-06-08 NOTE — Progress Notes (Signed)
Pt up to the nursing station asking for coffee, pt informed he could get some at 6 am, then pt stated he was scared and when he goes to sleep he has nightmares, pt informed to tell the doctor so he could get something for the nightmares if the doctor deems it appropriate. Pt continues to try to come up with things to stay at the nursing station. Pt given PRN Vistaril and PRN Ativan per MAR .

## 2019-06-09 MED ORDER — ARIPIPRAZOLE ER 400 MG IM SRER
400.0000 mg | INTRAMUSCULAR | Status: DC
  Administered 2019-06-09: 08:00:00 400 mg via INTRAMUSCULAR

## 2019-06-09 MED ORDER — ARIPIPRAZOLE ER 400 MG IM SRER: 400.0000 mg | INTRAMUSCULAR | 11 refills | Status: AC

## 2019-06-09 MED ORDER — OLANZAPINE 20 MG PO TABS: 20.0000 mg | ORAL_TABLET | Freq: Every day | ORAL | 1 refills | Status: AC

## 2019-06-09 MED ORDER — ARIPIPRAZOLE ER 400 MG IM SRER
400.0000 mg | INTRAMUSCULAR | Status: DC
  Filled 2019-06-09: qty 2

## 2019-06-09 MED ORDER — TRAZODONE HCL 300 MG PO TABS: 300.0000 mg | ORAL_TABLET | Freq: Every day | ORAL | 1 refills | Status: AC

## 2019-06-09 MED ORDER — OMEGA-3-ACID ETHYL ESTERS 1 G PO CAPS: 1.0000 g | ORAL_CAPSULE | Freq: Two times a day (BID) | ORAL | 2 refills | Status: DC

## 2019-06-09 NOTE — Progress Notes (Signed)
Adult Psychoeducational Group Note  Date:  06/09/2019 Time:  2:15 AM  Group Topic/Focus:  Wrap-Up Group:   The focus of this group is to help patients review their daily goal of treatment and discuss progress on daily workbooks.  Participation Level:  Active  Participation Quality:  Appropriate  Affect:  Appropriate  Cognitive:  Appropriate  Insight: Appropriate  Engagement in Group:  Developing/Improving  Modes of Intervention:  Discussion  Additional Comments:  Pt stated his goal for today was to talk with his doctor about his discharge plan. Pt stated he did not accomplished his goal today. Pt stated that he hopes to be discharged tomorrow.  Pt stated his relationship with his familyhas improved since he was admitted here.Pt stated his mother coming for visitation improved his day.Pt stated he felt better about himself today. Pt rated his overall day a 7 out of 10. Pt stated his appetite haspretty goodtoday. Pt stated his goal for tonight was to get some rest. Pt stated he was in no physical pain today. Pt stated he was nothearingare seeing anythingsthat was not there today.Pt stated he had no thoughts of harming himself or others today. Pt state if anything change he would alert staff.  [name] 06/09/2019, 2:15 AM

## 2019-06-09 NOTE — Progress Notes (Signed)
Recreation Therapy Notes  INPATIENT RECREATION TR PLAN  Patient Details Name: Clyde Zarrella MRN: [id number] DOB: Nov 13, 1995 Today's Date: 06/09/2019  Rec Therapy Plan Is patient appropriate for Therapeutic Recreation?: Yes Treatment times per week: about 3 days Estimated Length of Stay: 5-7 days TR Treatment/Interventions: Group participation (Comment)  Discharge Criteria Pt will be discharged from therapy if:: Discharged Treatment plan/goals/alternatives discussed and agreed upon by:: Patient/family  Discharge Summary Short term goals set: See patient care plan Short term goals met: Complete Progress toward goals comments: Groups attended Which groups?: Communication, Coping skills Reason goals not met: None Therapeutic equipment acquired: N/A Reason patient discharged from therapy: Discharge from hospital Pt/family agrees with progress & goals achieved: Yes Date patient discharged from therapy: 06/09/19     Victorino Sparrow, LRT/CTRS  Ria Comment, Chantia Amalfitano A 06/09/2019, 11:11 AM

## 2019-06-09 NOTE — Progress Notes (Signed)
  [facility] Adult Case Management Discharge Plan :  Will you be returning to the same living situation after discharge:  Yes,  home. At discharge, do you have transportation home?: Yes,  mother is picking up. Do you have the ability to pay for your medications: No. Declined referrals for community based mental health.  Release of information consent forms completed and in the chart. Letter on chart.  Patient to Follow up at: [facility]. Go to.   Why: Walk in hours for medication management are 8am-6pm, Monday through Friday. Please bring your hospital discharge paperwork, current medications, and photo ID with you. Wear a mask. Contact information: [address] [place], [place] [address] Phone: [phone] Fax: [phone]          Next level of care provider has access to [place] and Suicide Prevention discussed: Yes,  with mother, Lenell Antu.  Have you used any form of tobacco in the last 30 days? (Cigarettes, Smokeless Tobacco, Cigars, and/or Pipes): Yes  Has patient been referred to the Quitline?: Patient refused referral  Patient has been referred for addiction treatment: Pt. refused referral  Joellen Jersey, [place] 06/09/2019, 9:23 AM

## 2019-06-09 NOTE — BHH Counselor (Signed)
CSW attempted to return call to [facility], mother, [phone]. CSW left a HIPAA compliant voicemail with callback information.  Stephanie Acre, MSW, [place] Social Worker [facility] Adult Unit  [phone]

## 2019-06-09 NOTE — Plan of Care (Signed)
Pt was able to identify 3 positive coping skills at completion of recreation therapy group session.   Victorino Sparrow, LRT/CTRS

## 2019-06-09 NOTE — Progress Notes (Signed)
Recreation Therapy Notes  Date: 12.2.20 Time: 1000 Location: 500 Hall Dayroom  Group Topic: Communication  Goal Area(s) Addresses:  Patient will effectively communicate with peers in group.  Patient will verbalize benefit of healthy communication. Patient will verbalize positive effect of healthy communication on post d/c goals.  Patient will identify communication techniques that made activity effective for group.   Behavioral Response: None  Intervention: Drawings, Pencils, Blank Paper   Activity: Geometrical Drawings.  One person comes up a describes a geometrical drawing to the rest of the group.  The presenter will get as detailed as needed to describe the picture.  The remaining members of the group can only ask the presenter to repeat themselves.  They can not ask any specific questions.  When the presenter is finished, the group will compare their pictures to the original.  Education: Communication, Discharge Planning  Education Outcome: Acknowledges understanding/In group clarification offered/Needs additional education.   Clinical Observations/Feedback:  Pt was in group long enough to get the instructions.  Pt left and after processing.    Victorino Sparrow, LRT/CTRS     Ria Comment, Roshunda Keir A 06/09/2019 11:06 AM

## 2019-06-09 NOTE — BHH Suicide Risk Assessment (Signed)
[facility] Discharge Suicide Risk Assessment   Principal Problem: Substance-induced psychosis Discharge Diagnoses: Active Problems:   Psychosis ([place])   Total Time spent with patient: 45 minutes  Musculoskeletal: Strength & Muscle Tone: within normal limits Gait & Station: normal Patient leans: N/A  Psychiatric Specialty Exam: Physical Exam  ROS  Blood pressure 120/87, pulse 84, temperature (!) 97.3 F (36.3 C), temperature source Oral, resp. rate 18.There is no height or weight on file to calculate BMI.  General Appearance: Casual  Eye Contact:  Good  Speech:  nl  Volume:  Normal  Mood:  Euthymic  Affect:  Congruent  Thought Process:  Goal Directed and Descriptions of Associations: Circumstantial  Orientation:  Full (Time, Place, and Person)  Thought Content:  Logical  Suicidal Thoughts:  No  Homicidal Thoughts:  No  Memory:  Immediate;   Fair Recent;   Fair Remote;   Fair  Judgement:  Fair  Insight:  Fair  Psychomotor Activity:  Normal  Concentration:  Concentration: Fair and Attention Span: Fair  Recall:  [employer] of Knowledge:  Fair  Language:  Fair  Akathisia:  Negative  Handed:  Right  AIMS (if indicated):     Assets:  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  Patients presenting with no risk factors but with morbid ruminations; may be classified as minimal risk based on the severity of the depressive symptoms  Follow-up [facility]. Go to.    Why: Walk in hours for medication management are 8am-6pm, Monday through Friday. Please bring your hospital discharge paperwork, current medications, and photo ID with you. Wear a mask. Contact information: [address] [place],  [address] Phone: [phone] Fax: [phone]          Plan Of Care/Follow-up recommendations:  Activity:  full  Jodee Wagenaar, MD 06/09/2019, 7:33 AM

## 2019-06-09 NOTE — Discharge Summary (Signed)
Physician Discharge Summary Note  Patient:  Joshua Mahoney is an 23 y.o., male MRN:  [id number] DOB:  06-25-1996 Patient phone:  [phone] (home)  Patient address:   [address] [place] [address],  Total Time spent with patient: 45 minutes  Date of Admission:  06/04/2019 Date of Discharge: 06/09/2019  Reason for Admission:    History of Present Illness: Joshua Mahoney is a 23 year old male with history of polysubstance abuse presenting for treatment of new onset psychosis. Per chart review he was brought in voluntarily by law enforcement due to hallucinations. Per mother's report he had been agitated and awake for 48 hours. He was significantly agitated in the ED and on transfer to [facility] yesterday evening, requiring IM medications. Today he presents with calm affect but mildly pressured speech, disorganized behaviors, and tangential, hyper-religious thought content. He reports that he does not know if he is dead or in a coma. He reports that he is a prophet of the [place]aud[place], and his body was recently divided in half; Lucifer controls his left side, while a nice police officer named Joshua Mahoney controls his right side. He reports auditory hallucinations of Lucifer and Joshua Mahoney. When asked about visual hallucinations, he states belief that all the people who enter his room are a hallucination. He is worried that if he reads the Bible and closes the book, the book will say "the end" and he will be gone. He reports daily alcohol consumption (liquor) but shows no signs of withdrawal at this time. He reports a remote history of methamphetamine and cocaine use but denies drug use for the past year. UDS positive for THC. BAL <10. He denies SI/HI.  Principal Problem: As discussed psychosis in the context of current and past substance abuse Discharge Diagnoses: Active Problems:   Psychosis [facility])   Past Psychiatric History:   Past Medical History:  Past Medical History:  Diagnosis Date  . Polysubstance abuse  ([place])    History reviewed. No pertinent surgical history. Family History: History reviewed. No pertinent family history. Family Psychiatric  History: Father has bipolar Social History:  Social History   Substance and Sexual Activity  Alcohol Use Yes   Comment: bottle of wine last night     Social History   Substance and Sexual Activity  Drug Use No   Comment: heroin, xanax last use years ago    Social History   Socioeconomic History  . Marital status: Single    Spouse name: Not on file  . Number of children: Not on file  . Years of education: Not on file  . Highest education level: Not on file  Occupational History  . Not on file  Social Needs  . Financial resource strain: Patient refused  . Food insecurity    Worry: Patient refused    Inability: Patient refused  . Transportation needs    Medical: Patient refused    Non-medical: Patient refused  Tobacco Use  . Smoking status: Current Every Day Smoker    Packs/day: 1.00    Types: Cigarettes  . Smokeless tobacco: Never Used  Substance and Sexual Activity  . Alcohol use: Yes    Comment: bottle of wine last night  . Drug use: No    Comment: heroin, xanax last use years ago  . Sexual activity: Not on file  Lifestyle  . Physical activity    Days per week: Patient refused    Minutes per session: Patient refused  . Stress: Not on file  Relationships  .  Social Musician on phone: Patient refused    Gets together: Patient refused    Attends religious service: Patient refused    Active member of club or organization: Patient refused    Attends meetings of clubs or organizations: Patient refused    Relationship status: Patient refused  Other Topics Concern  . Not on file  Social History Narrative  . Not on file    Hospital Course:    Patient's initial level of disorganization improved fairly quickly however he remained intermittently hypomanic and always lobbying for discharge.  He minimizes chemical  dependency issues.  He was generally intrusive and demanding discharge throughout his stay.  By the date of 12/1 he was impatient with the pace of rounds and he was so eager for discharge, he further ripped out the bench in his room and required IM medications.  By the date of the second he was alert and oriented to person place time situation somewhat apologetic but denied auditory and visual hallucinations denied thoughts of harming self or others and could contract.  Had been in touch with his mother and we will administer long-acting injectable prior to discharge.  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  Physical Findings: AIMS: Facial and Oral Movements Muscles of Facial Expression: None, normal Lips and Perioral Area: None, normal Jaw: None, normal Tongue: None, normal,Extremity Movements Upper (arms, wrists, hands, fingers): None, normal Lower (legs, knees, ankles, toes): None, normal, Trunk Movements Neck, shoulders, hips: None, normal, Overall Severity Severity of abnormal movements (highest score from questions above): None, normal Incapacitation due to abnormal movements: None, normal Patient's awareness of abnormal movements (rate only patient's report): No Awareness, Dental Status Current problems with teeth and/or dentures?: No Does patient usually wear dentures?: No  CIWA:  CIWA-Ar Total: 3 COWS:     Musculoskeletal: Strength & Muscle Tone: within normal limits Gait & Station: normal Patient leans: N/A  Psychiatric Specialty Exam: Physical Exam  ROS  Blood pressure 120/87, pulse 84, temperature (!) 97.3 F (36.3 C), temperature source Oral, resp. rate 18.There is no height or weight on file to calculate BMI.  General Appearance: Casual  Eye Contact:  Good  Speech:  nl  Volume:  Normal  Mood:  Euthymic  Affect:  Congruent  Thought Process:  Goal Directed and Descriptions of  Associations: Circumstantial  Orientation:  Full (Time, Place, and Person)  Thought Content:  Logical  Suicidal Thoughts:  No  Homicidal Thoughts:  No  Memory:  Immediate;   Fair Recent;   Fair Remote;   Fair  Judgement:  Fair  Insight:  Fair  Psychomotor Activity:  Normal  Concentration:  Concentration: Fair and Attention Span: Fair  Recall:  [employer] of Knowledge:  Fair  Language:  Fair  Akathisia:  Negative  Handed:  Right  AIMS (if indicated):     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 (Cigarettes, Smokeless Tobacco, Cigars, and/or Pipes): Yes  Has this patient used any form of tobacco in the last 30 days? (Cigarettes, Smokeless Tobacco, Cigars, and/or Pipes) Yes, No  Blood Alcohol level:  Lab Results  Component Value Date   ETH <10 06/04/2019    Metabolic Disorder Labs:  No results found for: HGBA1C,  MPG No results found for: PROLACTIN No results found for: CHOL, TRIG, HDL, CHOLHDL, VLDL, LDLCALC  See Psychiatric Specialty Exam and Suicide Risk Assessment completed by Attending Physician prior to discharge.  Discharge destination:  Home  Is patient on multiple antipsychotic therapies at discharge:  No   Has Patient had three or more failed trials of antipsychotic monotherapy by history:  No  Recommended Plan for Multiple Antipsychotic Therapies: NA   Allergies as of 06/09/2019   No Known Allergies     Medication List    STOP taking these medications   zolpidem 6.25 MG CR tablet Commonly known as: AMBIEN CR     TAKE these medications     Indication  ARIPiprazole ER 400 MG Srer injection Commonly known as: ABILIFY MAINTENA Inject 2 mLs (400 mg total) into the muscle every 28 (twenty-eight) days.  Indication: Schizophrenia   OLANZapine 20 MG tablet Commonly known as: ZYPREXA Take 1 tablet (20 mg total) by  mouth at bedtime.  Indication: Hypomanic Episode of Bipolar Disorder   omega-3 acid ethyl esters 1 g capsule Commonly known as: LOVAZA Take 1 capsule (1 g total) by mouth 2 (two) times daily.  Indication: High Amount of Triglycerides in the Blood   trazodone 300 MG tablet Commonly known as: DESYREL Take 1 tablet (300 mg total) by mouth at bedtime.  Indication: Trouble Sleeping      Follow-up Information    [facility]. Go to.   Why: Walk in hours for medication management are 8am-6pm, Monday through Friday. Please bring your hospital discharge paperwork, current medications, and photo ID with you. Wear a mask. Contact information: [address] [place], [place] [address] Phone: [phone] Fax: [phone]         Signed: Malvin JohnsFARAH,Casi Westerfeld, MD 06/09/2019, 7:27 AM

## 2019-06-17 ENCOUNTER — Other Ambulatory Visit: Payer: Self-pay

## 2019-06-17 ENCOUNTER — Encounter (HOSPITAL_COMMUNITY): Payer: Self-pay | Admitting: Emergency Medicine

## 2019-06-17 ENCOUNTER — Emergency Department (HOSPITAL_COMMUNITY)
Admission: EM | Admit: 2019-06-17 | Discharge: 2019-06-26 | Disposition: A | Discharge status: Other Acute Inpatient Hospital | Payer: Self-pay | Attending: Emergency Medicine | Admitting: Emergency Medicine

## 2019-06-17 DIAGNOSIS — F28 Other psychotic disorder not due to a substance or known physiological condition: Secondary | ICD-10-CM

## 2019-06-17 DIAGNOSIS — F17210 Nicotine dependence, cigarettes, uncomplicated: Secondary | ICD-10-CM | POA: Insufficient documentation

## 2019-06-17 DIAGNOSIS — Z20828 Contact with and (suspected) exposure to other viral communicable diseases: Secondary | ICD-10-CM | POA: Insufficient documentation

## 2019-06-17 DIAGNOSIS — Z79899 Other long term (current) drug therapy: Secondary | ICD-10-CM | POA: Insufficient documentation

## 2019-06-17 DIAGNOSIS — F23 Brief psychotic disorder: Secondary | ICD-10-CM | POA: Insufficient documentation

## 2019-06-17 DIAGNOSIS — F29 Unspecified psychosis not due to a substance or known physiological condition: Secondary | ICD-10-CM | POA: Diagnosis present

## 2019-06-17 LAB — COMPREHENSIVE METABOLIC PANEL
ALT: 25 U/L (ref 0–44)
AST: 20 U/L (ref 15–41)
Albumin: 4.1 g/dL (ref 3.5–5.0)
Alkaline Phosphatase: 49 U/L (ref 38–126)
Anion gap: 11 (ref 5–15)
BUN: 13 mg/dL (ref 6–20)
CO2: 24 mmol/L (ref 22–32)
Calcium: 9 mg/dL (ref 8.9–10.3)
Chloride: 104 mmol/L (ref 98–111)
Creatinine, Ser: 0.83 mg/dL (ref 0.61–1.24)
GFR calc Af Amer: >60 mL/min (ref >60)
GFR calc non Af Amer: >60 mL/min (ref >60)
Glucose, Bld: 112 mg/dL — ABNORMAL HIGH (ref 70–99)
Potassium: 4 mmol/L (ref 3.5–5.1)
Sodium: 139 mmol/L (ref 135–145)
Total Bilirubin: 0.3 mg/dL (ref 0.3–1.2)
Total Protein: 7 g/dL (ref 6.5–8.1)

## 2019-06-17 LAB — ETHANOL: Alcohol, Ethyl (B): <10 mg/dL (ref <10)

## 2019-06-17 LAB — RAPID URINE DRUG SCREEN, HOSP PERFORMED
Amphetamines: NOT DETECTED
Barbiturates: NOT DETECTED
Benzodiazepines: NOT DETECTED
Cocaine: NOT DETECTED
Opiates: NOT DETECTED
Tetrahydrocannabinol: NOT DETECTED

## 2019-06-17 LAB — SARS CORONAVIRUS 2 (TAT 6-24 HRS): SARS Coronavirus 2: NEGATIVE

## 2019-06-17 MED ORDER — ZIPRASIDONE MESYLATE 20 MG IM SOLR
20.0000 mg | INTRAMUSCULAR | Status: AC | PRN
  Administered 2019-06-17: 20 mg via INTRAMUSCULAR
  Filled 2019-06-17: qty 20

## 2019-06-17 MED ORDER — ZIPRASIDONE MESYLATE 20 MG IM SOLR
10.0000 mg | Freq: Once | INTRAMUSCULAR | Status: AC
  Administered 2019-06-17: 10 mg via INTRAMUSCULAR
  Filled 2019-06-17: qty 20

## 2019-06-17 MED ORDER — STERILE WATER FOR INJECTION IJ SOLN
INTRAMUSCULAR | Status: AC
  Administered 2019-06-17: 20:00:00
  Filled 2019-06-17: qty 10

## 2019-06-17 MED ORDER — OMEGA-3-ACID ETHYL ESTERS 1 G PO CAPS
1.0000 g | ORAL_CAPSULE | Freq: Two times a day (BID) | ORAL | Status: DC
  Administered 2019-06-17 – 2019-06-25 (×14): 1 g via ORAL
  Filled 2019-06-17 (×23): qty 1

## 2019-06-17 MED ORDER — OLANZAPINE 5 MG PO TABS
20.0000 mg | ORAL_TABLET | Freq: Every day | ORAL | Status: DC
  Administered 2019-06-18: 20 mg via ORAL
  Filled 2019-06-17: qty 4

## 2019-06-17 MED ORDER — ALBUTEROL SULFATE HFA 108 (90 BASE) MCG/ACT IN AERS
2.0000 | INHALATION_SPRAY | RESPIRATORY_TRACT | Status: DC | PRN
  Administered 2019-06-17 – 2019-06-26 (×9): 2 via RESPIRATORY_TRACT
  Filled 2019-06-17 (×3): qty 6.7

## 2019-06-17 MED ORDER — OLANZAPINE 5 MG PO TBDP
10.0000 mg | ORAL_TABLET | Freq: Three times a day (TID) | ORAL | Status: DC | PRN
  Administered 2019-06-17 – 2019-06-26 (×9): 10 mg via ORAL
  Filled 2019-06-17 (×11): qty 2

## 2019-06-17 MED ORDER — NICOTINE 21 MG/24HR TD PT24: 21.0000 mg | MEDICATED_PATCH | Freq: Every day | TRANSDERMAL | Status: DC

## 2019-06-17 MED ORDER — NICOTINE 14 MG/24HR TD PT24
14.0000 mg | MEDICATED_PATCH | Freq: Every day | TRANSDERMAL | Status: DC
  Administered 2019-06-17 – 2019-06-20 (×5): 14 mg via TRANSDERMAL
  Filled 2019-06-17 (×5): qty 1

## 2019-06-17 MED ORDER — LORAZEPAM 1 MG PO TABS
1.0000 mg | ORAL_TABLET | ORAL | Status: AC | PRN
  Administered 2019-06-17: 1 mg via ORAL
  Filled 2019-06-17: qty 1

## 2019-06-17 MED ORDER — STERILE WATER FOR INJECTION IJ SOLN
INTRAMUSCULAR | Status: AC
  Administered 2019-06-17: 1 mL
  Filled 2019-06-17: qty 10

## 2019-06-17 MED ORDER — TRAZODONE HCL 50 MG PO TABS
300.0000 mg | ORAL_TABLET | Freq: Every day | ORAL | Status: DC
  Administered 2019-06-18 – 2019-06-25 (×4): 300 mg via ORAL
  Filled 2019-06-17 (×7): qty 6

## 2019-06-17 NOTE — ED Notes (Signed)
Client noncompliant,not wanting to stay in room.  Client threw bed linen and pillow at staff.  Bed taken from room and client's mattress is on the floor.  Client crying uncontrollably, sitting on mattress.
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
## 2019-06-17 NOTE — ED Notes (Signed)
Pt throwing pillow and trying to remove mattress.  Stretcher removed and mattress.

## 2019-06-17 NOTE — ED Notes (Signed)
Pt also c/o auditory hallucinations.

## 2019-06-17 NOTE — ED Notes (Signed)
IVC paperwork faxed to [place].

## 2019-06-17 NOTE — ED Notes (Signed)
Pt is calm and cooperative at this time 

## 2019-06-17 NOTE — ED Notes (Signed)
Client asked for ice water...client promptly poured water over himself.  Client repeats "I just want to go home".  Offered client dry clothes/scrubs .  Client refused.

## 2019-06-17 NOTE — ED Notes (Signed)
Pt given a heated up dinner tray per request. Pt resting with eyes closed at this time.

## 2019-06-17 NOTE — ED Notes (Signed)
Pt is pacing in and out of room.  On computer, turning water on and hitting call light.  Security called to assess.

## 2019-06-17 NOTE — ED Notes (Signed)
Pt given coffee per request

## 2019-06-17 NOTE — ED Notes (Addendum)
Refusing labs.  Attempting to throw water.  Will not allow nurse to obtain COVID swab.

## 2019-06-17 NOTE — ED Notes (Signed)
Periods of agitation.

## 2019-06-17 NOTE — ED Notes (Signed)
[facility] department called and arrived to assess.

## 2019-06-17 NOTE — ED Notes (Signed)
Pt is listening to christian music on phone in room.

## 2019-06-17 NOTE — ED Notes (Signed)
Pt was threatening to leave and pacing. Pt was given geodon and meal tray.
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
## 2019-06-17 NOTE — ED Triage Notes (Signed)
Pt states he does not want to be a false prophet of God. He states he has been drinking alcohol and having panic attacks. Pt states he has not been sleeping well. Pt denies any si/hi ideations.

## 2019-06-17 NOTE — ED Notes (Signed)
[facility] Department called.

## 2019-06-17 NOTE — ED Notes (Signed)
Patient takes off clothes(mainly his scrub top), when asks to put them back on, intermittently, he complies.

## 2019-06-17 NOTE — ED Notes (Signed)
Pt continues to pace. Taking shirt on and off.  Pt does follow structions.

## 2019-06-17 NOTE — ED Notes (Signed)
Security and [employer] in area.

## 2019-06-17 NOTE — ED Notes (Signed)
Pt became verbal w this RN stating he wanted to leave & that he wanted to go home & die w/his family, pt attempted to contact mother, unsuccessful, pt agreed to take shot to calm down. Pt very anxious at this time, will continue to monitor post meds. See MAR for med admin

## 2019-06-17 NOTE — ED Notes (Signed)
Pt belongings placed in locker. Pt belongings as follows: Sweatshirt, shirt, pants, socks, shoes, phone, and keys.

## 2019-06-17 NOTE — ED Provider Notes (Signed)
Patient awaiting further behavioral health assessment and likely placement inpatient due to worsening agitation and hallucinations.  On exam patient is pacing around the room and admits having difficulty discerning which of the 3 voices is his.  Police in the room.  Patient able to be verbally calmed and agreed for IM medication to assist.  CRITICAL CARE Performed by: Mariea Clonts   Total critical care time: 35 minutes  Critical care time was exclusive of separately billable procedures and treating other patients.  Critical care was necessary to treat or prevent imminent or life-threatening deterioration.  Critical care was time spent personally by me on the following activities: development of treatment plan with patient and/or surrogate as well as nursing, discussions with consultants, evaluation of patient's response to treatment, examination of patient, obtaining history from patient or surrogate, ordering and performing treatments and interventions, ordering and review of laboratory studies, ordering and review of radiographic studies, pulse oximetry and re-evaluation of patient's condition.     Elnora Morrison, MD 06/17/19 1540

## 2019-06-17 NOTE — ED Notes (Addendum)
0515 TTS assessment attempted, Per Vassie Loll, RN, patient Geodon due to aggressive behaviors, unable to complete assessment at this time.

## 2019-06-18 ENCOUNTER — Encounter (HOSPITAL_COMMUNITY): Payer: Self-pay

## 2019-06-18 MED ORDER — ALUM & MAG HYDROXIDE-SIMETH 200-200-20 MG/5ML PO SUSP
15.0000 mL | Freq: Once | ORAL | Status: AC
  Administered 2019-06-18: 15 mL via ORAL
  Filled 2019-06-18: qty 30

## 2019-06-18 MED ORDER — LORAZEPAM 1 MG PO TABS
1.0000 mg | ORAL_TABLET | ORAL | Status: AC | PRN
  Administered 2019-06-19: 1 mg via ORAL
  Filled 2019-06-18: qty 1

## 2019-06-18 MED ORDER — ZIPRASIDONE MESYLATE 20 MG IM SOLR
INTRAMUSCULAR | Status: AC
  Filled 2019-06-18: qty 20

## 2019-06-18 MED ORDER — STERILE WATER FOR INJECTION IJ SOLN
INTRAMUSCULAR | Status: AC
  Administered 2019-06-18: 1.2 mL
  Filled 2019-06-18: qty 10

## 2019-06-18 MED ORDER — OLANZAPINE 5 MG PO TABS
10.0000 mg | ORAL_TABLET | Freq: Two times a day (BID) | ORAL | Status: DC
  Administered 2019-06-18 – 2019-06-25 (×14): 10 mg via ORAL
  Filled 2019-06-18 (×15): qty 2

## 2019-06-18 MED ORDER — ACETAMINOPHEN 325 MG PO TABS
650.0000 mg | ORAL_TABLET | Freq: Once | ORAL | Status: AC
  Administered 2019-06-18: 650 mg via ORAL
  Filled 2019-06-18: qty 2

## 2019-06-18 MED ORDER — ZIPRASIDONE MESYLATE 20 MG IM SOLR
20.0000 mg | Freq: Once | INTRAMUSCULAR | Status: AC
  Administered 2019-06-18: 20 mg via INTRAMUSCULAR

## 2019-06-18 MED ORDER — ZIPRASIDONE HCL 20 MG PO CAPS
20.0000 mg | ORAL_CAPSULE | Freq: Once | ORAL | Status: AC
  Administered 2019-06-18: 20 mg via ORAL
  Filled 2019-06-18: qty 1

## 2019-06-18 MED ORDER — LORAZEPAM 2 MG/ML IJ SOLN
1.0000 mg | Freq: Once | INTRAMUSCULAR | Status: AC
  Administered 2019-06-18: 1 mg via INTRAMUSCULAR
  Filled 2019-06-18: qty 1

## 2019-06-18 MED ORDER — OXCARBAZEPINE 150 MG PO TABS
150.0000 mg | ORAL_TABLET | Freq: Two times a day (BID) | ORAL | Status: DC
  Administered 2019-06-18 – 2019-06-19 (×4): 150 mg via ORAL
  Filled 2019-06-18 (×7): qty 1

## 2019-06-18 MED ORDER — LORAZEPAM 1 MG PO TABS
1.0000 mg | ORAL_TABLET | Freq: Once | ORAL | Status: AC
  Administered 2019-06-18: 1 mg via ORAL
  Filled 2019-06-18: qty 1

## 2019-06-18 MED ORDER — ZIPRASIDONE MESYLATE 20 MG IM SOLR
20.0000 mg | Freq: Two times a day (BID) | INTRAMUSCULAR | Status: DC | PRN
  Administered 2019-06-19 – 2019-06-25 (×5): 20 mg via INTRAMUSCULAR
  Filled 2019-06-18 (×5): qty 20

## 2019-06-18 NOTE — ED Notes (Signed)
Patient is very anxious but compliant.

## 2019-06-18 NOTE — ED Notes (Signed)
Pt slept through visit with mother. Mother given visitation paper. Mother informed of disposition of inpatient placement and verbalized understanding. Visitation over at this time.

## 2019-06-18 NOTE — ED Notes (Signed)
Pt took shower this morning 

## 2019-06-18 NOTE — ED Notes (Signed)
Pt remains agitated, stating he wants to go home and eat. Informed pt he can not leave. Pt given snack.

## 2019-06-18 NOTE — ED Notes (Signed)
Pt very agitated. Pt non cooperative at this time.

## 2019-06-18 NOTE — ED Notes (Signed)
Vanita Panda, MD at bedside, pt pacing and yelling, stating, " I am scared. I just want to go home." pt difficult to redirect, security at bedside

## 2019-06-18 NOTE — ED Notes (Signed)
Pt agitated, crushed water cup and threw in floor, pt yelling and attempting to leave. Dr Vanita Panda notified. Security called and at bedside. Pt pacing floor in room.

## 2019-06-18 NOTE — ED Notes (Signed)
Mother wanded by security. Mother at pt bedside. Pt sleeping at this time.

## 2019-06-18 NOTE — ED Notes (Signed)
[employer] and [employer] at bedside.

## 2019-06-18 NOTE — ED Notes (Signed)
Pt attempting to leave, Pt agitated. Pt wandering in hall in ED, yelling and attempting to walk out. [employer] called, EDP made aware. Orders received.

## 2019-06-18 NOTE — ED Notes (Signed)
Pt starting to get agitated again, pt stating he needs to leave. Pt redirected at this time.

## 2019-06-18 NOTE — ED Notes (Signed)
Patient is yelling and not listening. Notified nurse and contacted security.

## 2019-06-18 NOTE — ED Notes (Signed)
I contacted [place] at this time and questioned pt's disposition which was set to refer to outpatient resouses due to his psychosis and frantic behavior this evening. [employer] requesting that the EDP call their medical director and talk with her about pt's behaviors today. Dr. Vanita Panda called [facility] medical direct Dr. Dwyane Dee and they will change his disposition to pt meets inpatient placement and gave medication changes to Dr. Vanita Panda to be entered.

## 2019-06-18 NOTE — ED Notes (Signed)
Pt allowed to shower. Stretcher placed back in pts room. Pt cooperative. Law enforcement remains at bedside.

## 2019-06-18 NOTE — ED Notes (Signed)
Pt took nicotine patch off. Pt states "I think it's too strong for me"

## 2019-06-18 NOTE — ED Notes (Signed)
Pt starting to get agitated. [employer] remains at bedside.

## 2019-06-18 NOTE — ED Notes (Signed)
Pt agitated. Pt pacing in room.

## 2019-06-18 NOTE — ED Provider Notes (Signed)
Blood pressure 136/79, pulse 93, temperature 97.7 F (36.5 C), temperature source Oral, resp. rate 20, weight 75.8 kg, SpO2 97 %.  In short, Joshua Mahoney is a 23 y.o. male with a chief complaint of V70.1 .  Refer to the original H&P for additional details.  03:04 PM  Patient is walking around the emergency department agitated and shouting at staff.  He is unable to be redirected verbally.  He is taking aggressive posturing at times.  I am unable to de-escalate verbally and have ordered Geodon and Ativan to be given IM.  Patient to be monitored closely after by staff.   CRITICAL CARE Performed by: Margette Fast Total critical care time: 30 minutes Critical care time was exclusive of separately billable procedures and treating other patients. Critical care was necessary to treat or prevent imminent or life-threatening deterioration. Critical care was time spent personally by me on the following activities: development of treatment plan with patient and/or surrogate as well as nursing, discussions with consultants, evaluation of patient's response to treatment, examination of patient, obtaining history from patient or surrogate, ordering and performing treatments and interventions, ordering and review of laboratory studies, ordering and review of radiographic studies, pulse oximetry and re-evaluation of patient's condition.  [name], MD Emergency Medicine    [name], [name], MD 06/19/19 [phone]

## 2019-06-18 NOTE — ED Notes (Signed)
Pt remains agitated, trying to walk out of the room. Pt redirected in room. Pt states "Can you please just get me some food so I know I'm real" Informed pt lunch trays will be up shortly. Pt requesting to take shower, informed pt per tech pt had shower this am at 0700.
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
## 2019-06-18 NOTE — ED Notes (Signed)
Pt had a shower at 0700, currently pacing his room asking for anxiety meds, a nicotene patch, and to leave.

## 2019-06-19 MED ORDER — STERILE WATER FOR INJECTION IJ SOLN
INTRAMUSCULAR | Status: AC
  Administered 2019-06-19: 1.2 mL
  Filled 2019-06-19: qty 10

## 2019-06-19 MED ORDER — LORAZEPAM 1 MG PO TABS
2.0000 mg | ORAL_TABLET | Freq: Once | ORAL | Status: AC
  Administered 2019-06-19: 2 mg via ORAL
  Filled 2019-06-19: qty 2

## 2019-06-19 MED ORDER — ALUM & MAG HYDROXIDE-SIMETH 200-200-20 MG/5ML PO SUSP
30.0000 mL | Freq: Once | ORAL | Status: AC
  Administered 2019-06-19: 30 mL via ORAL
  Filled 2019-06-19: qty 30

## 2019-06-19 MED ORDER — LORAZEPAM 1 MG PO TABS
1.0000 mg | ORAL_TABLET | ORAL | Status: DC | PRN
  Administered 2019-06-19 – 2019-06-26 (×26): 1 mg via ORAL
  Filled 2019-06-19 (×28): qty 1

## 2019-06-19 NOTE — ED Notes (Signed)
Pt given sprite per request 

## 2019-06-19 NOTE — ED Notes (Signed)
Pt given second tray

## 2019-06-19 NOTE — ED Notes (Signed)
Pt is still quite agitated but does state, "I'm starting to feel the medicine work". Pt is very anxious and pacing room. Pt talks about "being a prophet" and working on his sins which are "adultery and pride". Feels as if everyone is talking about him and he reports frustration and overwhelming feelings. Pt continues to request that he get discharged so he can go take care of everyone else. RN attempted to verbally deescalate pt, somewhat successfully. Will continue to monitor pt's condition and medication response.

## 2019-06-19 NOTE — ED Notes (Signed)
Pt reports the medication he was given is helping him calm down. Pt sitting in chair and talking with sitter and Energy manager.

## 2019-06-19 NOTE — ED Notes (Signed)
Pt given lunch tray.

## 2019-06-19 NOTE — ED Notes (Signed)
Pt woke up agitated and began pacing in room. Security called to ED. Pt repeating self, requesting to make phone calls and watch the news on TV despite knowing it heightens his agitation. Pt provided a snack and verbal de-escalation. Pt requesting to leave and/or talk to psychiatrist to find out how he can get out of here.

## 2019-06-19 NOTE — ED Notes (Signed)
Pt completed TTS.  

## 2019-06-19 NOTE — ED Notes (Signed)
Pt in shower at this time

## 2019-06-19 NOTE — ED Notes (Signed)
Pt given coffee per request

## 2019-06-19 NOTE — ED Notes (Signed)
Pt using phone for 3rd call for today.

## 2019-06-19 NOTE — ED Notes (Signed)
Pt has calmed down and is now lying in bed falling a sleep.

## 2019-06-19 NOTE — ED Notes (Signed)
Pt given phone to make a call to his brother. Pt brother did not answer.

## 2019-06-19 NOTE — ED Notes (Signed)
Pt given dinner tray.

## 2019-06-19 NOTE — ED Provider Notes (Signed)
Patient assessed this morning.  He is calm and cooperative.  He is drawing and writing in his room.  He denies hearing any voices. He denies any pain.  He is awaiting psychiatric placement.  BP 134/84 (BP Location: Right Arm)   Pulse 80   Temp 97.9 F (36.6 C) (Axillary)   Resp 18   Wt 75.8 kg   SpO2 98%   BMI 23.31 kg/m     Ezequiel Essex, MD 06/19/19 [phone]

## 2019-06-19 NOTE — Progress Notes (Signed)
Patient meets criteria for inpatient treatment per Priscille Loveless, NP. No appropriate beds at [facility] currently. CSW faxed referral to the following facility for review:  [facility].  [facility] authorization number is [id number].  TTS will continue to seek bed placement.     Darletta Moll MSW, [place] Worker Disposition  [facility] Ph: [phone] Fax: [phone] 06/19/2019 11:01 AM

## 2019-06-19 NOTE — ED Notes (Signed)
Pt pacing in room asking multiple times for Dr. Abbott Pao becoming increasingly agitated wanting to go home.

## 2019-06-19 NOTE — ED Notes (Signed)
Pt woke up agitated and began pacing room. MD notified and agreed Pt should receive Geodon. This RN approached the Pt and discussed medication options to calm down his agitation. Pt agreed Geodon would be a good option. Pt provided phone call to contact mother and provided list of visitation hours and [facility] policy.

## 2019-06-19 NOTE — ED Notes (Signed)
Pt drawing in room and watching tv

## 2019-06-19 NOTE — [facility] Counselor (Signed)
Re-assessment:   Patient showed TTS Quarry manager he has been writing. Report his voice on the inside has been telling him what to write. Discussed he's a prophet of the loud. Discussed he wanted to go home and not burn in hail. Patient continues to be delusional. Report he wanted to return home stating he's scared to be in the hospital by himself away from his family. Report he does not want to go to hell. Patient then ended the assessment.    Priscille Loveless, NP, continue to recommend inpatient treatment

## 2019-06-19 NOTE — ED Notes (Signed)
Pt given sandwich in place of breakfast tray

## 2019-06-19 NOTE — ED Notes (Signed)
Pt resting in dark with eyes closed

## 2019-06-19 NOTE — ED Notes (Signed)
Pt insistent on this NT getting the Dr so he can discharge him so he can go to the white house and become president. Pt made aware multiple times that the Dr is busy and will come to him when he is able. Pt agitated saying he's running out of time and that it's an emergency.

## 2019-06-19 NOTE — ED Notes (Signed)
Pt given breakfast tray, pt's mom in room

## 2019-06-19 NOTE — ED Notes (Signed)
Pt has remained a sleep during this RNs care and law enforcement have been signed off and released to return to duty. Sitter present and reports feeling comfortable at this time without having law enforcement presence.

## 2019-06-19 NOTE — ED Notes (Signed)
Pt asking to see dr and for anxiety medication. RN brought pt medication and Dr notified pt wants to see him. Pt currently doing push-ups in floor. Pt becoming increasingly agitated pacing back and forth.

## 2019-06-20 MED ORDER — LIDOCAINE VISCOUS HCL 2 % MT SOLN
15.0000 mL | Freq: Once | OROMUCOSAL | Status: AC
  Administered 2019-06-20: 15 mL via ORAL
  Filled 2019-06-20: qty 15

## 2019-06-20 MED ORDER — OXCARBAZEPINE 300 MG PO TABS
300.0000 mg | ORAL_TABLET | Freq: Two times a day (BID) | ORAL | Status: DC
  Administered 2019-06-20 – 2019-06-25 (×10): 300 mg via ORAL
  Filled 2019-06-20 (×17): qty 1

## 2019-06-20 MED ORDER — ALUM & MAG HYDROXIDE-SIMETH 200-200-20 MG/5ML PO SUSP
30.0000 mL | Freq: Once | ORAL | Status: AC
  Administered 2019-06-20: 30 mL via ORAL
  Filled 2019-06-20: qty 30

## 2019-06-20 MED ORDER — ACETAMINOPHEN 325 MG PO TABS
650.0000 mg | ORAL_TABLET | Freq: Once | ORAL | Status: AC
  Administered 2019-06-20: 650 mg via ORAL
  Filled 2019-06-20: qty 2

## 2019-06-20 NOTE — ED Notes (Signed)
Pt shut his door so this NT opened it back up and pt was standing on bed pushing on ceiling tiles. This NT asked what he was doing and he said "I thought you told me to do something". Pt sat back down in chair and began coloring.

## 2019-06-20 NOTE — ED Notes (Addendum)
Pt taken to shower per request.  

## 2019-06-20 NOTE — ED Notes (Signed)
Pt has attempted to call mom multiple times with no answer.

## 2019-06-20 NOTE — [facility] Counselor (Signed)
Re-assessment:   Patient re-assessed this morning. Patient bed in the hallway versus the his room because patient has requested to be in the hallway. Patient stated, "I am claustrophobia. 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 Patient rambled about starting multiple businesses using the name '[place].' Patient reported he was ready to leave the hospital and return home.

## 2019-06-20 NOTE — ED Notes (Signed)
Pt given dinner tray.

## 2019-06-20 NOTE — ED Notes (Signed)
Pt given lunch tray.

## 2019-06-20 NOTE — ED Notes (Signed)
Pt given breakfast tray

## 2019-06-20 NOTE — ED Notes (Signed)
Pt asking for his belongings. Pt informed multiple times that he cannot have his belongings as long as he is at this hospital. Pt resting in room watching tv at this time.

## 2019-06-20 NOTE — ED Notes (Signed)
Pt pacing in room saying things that don't make much sense.

## 2019-06-20 NOTE — ED Notes (Signed)
Pt getting agitated because his family won't answer the phone.

## 2019-06-20 NOTE — ED Notes (Signed)
Pt restless, pacing in room and requesting medications.  Pt requesting to talk with [facility] as well.  Called [facility] to inform them of this.

## 2019-06-20 NOTE — ED Notes (Signed)
Pt given coloring pages to keep busy

## 2019-06-20 NOTE — ED Notes (Signed)
Pt.s mom is visiting. 

## 2019-06-21 MED ORDER — NICOTINE 21 MG/24HR TD PT24
21.0000 mg | MEDICATED_PATCH | Freq: Every day | TRANSDERMAL | Status: AC
  Administered 2019-06-21 – 2019-06-26 (×6): 21 mg via TRANSDERMAL
  Filled 2019-06-21 (×7): qty 1

## 2019-06-21 MED ORDER — LIDOCAINE HCL (PF) 1 % IJ SOLN
INTRAMUSCULAR | Status: AC
  Administered 2019-06-21: 2 mL
  Filled 2019-06-21: qty 2

## 2019-06-21 MED ORDER — ACETAMINOPHEN 325 MG PO TABS
650.0000 mg | ORAL_TABLET | Freq: Once | ORAL | Status: AC
  Administered 2019-06-21: 650 mg via ORAL
  Filled 2019-06-21: qty 2

## 2019-06-21 NOTE — ED Notes (Signed)
Pt is anxious and asking to go home.

## 2019-06-21 NOTE — ED Notes (Signed)
Pt is delusional.  Has written out on paper with crayon the reason why he need to be discharged.

## 2019-06-21 NOTE — ED Notes (Addendum)
Pt asked for ativan and when time to given pt med, he says,"I ain't taking it until I get something to eat.  I can wait all day."  Informed pt that I will hold until his tray comes.

## 2019-06-21 NOTE — ED Notes (Signed)
Pt is very restless and constantly asking to see nurse.  Easily redirected to room.

## 2019-06-21 NOTE — ED Notes (Signed)
Family at bedside. 

## 2019-06-21 NOTE — ED Notes (Signed)
On duty officer and security in to speak with pt.

## 2019-06-21 NOTE — ED Notes (Signed)
Pt using 3rd phone call to call his mother.

## 2019-06-21 NOTE — ED Notes (Signed)
Joshua Mahoney is tearful.  Pt wrote me a note says, "forgive me guys 06/21/2019, I just now realize what a idiot I've been, forgive me please.  God has been so grateful to me."  Pt is easily redirected.

## 2019-06-21 NOTE — ED Provider Notes (Addendum)
Emergency Medicine Observation Re-evaluation Note  Joshua Mahoney is a 23 y.o. male, seen on rounds today.  Pt initially presented to the ED for complaints of  Delusions, agitation, psychosis. Currently, the patient is stable pending placement.  Physical Exam  BP (!) 121/93 (BP Location: Right Leg)   Pulse 85   Temp 98.1 F (36.7 C)   Resp 16   Wt 75.8 kg   SpO2 99%   BMI 23.31 kg/m  Physical Exam Awake, walking outside the room Calm, breathing easily ED Course / MDM  EKG:EKG Interpretation  Date/Time:  Thursday June 17 2019 04:20:47 EST Ventricular Rate:  89 PR Interval:    QRS Duration: 83 QT Interval:  361 QTC Calculation: 440 R Axis:   62 Text Interpretation: Sinus rhythm Early repolarization (normal variant) Confirmed by Orpah Greek [phone]) on 06/17/2019 4:43:23 AM    I have reviewed the labs performed to date as well as medications administered while in observation.  Recent changes in the last 24 hours include none. Plan  Current plan is for inpatient treatment. Patient is under full IVC at this time.       Dorie Rank, MD 06/21/19 [phone]

## 2019-06-21 NOTE — BH Assessment (Signed)
Continued inpt tx. Joshua Mahoney at 2313 confirmed that patient remains on their wait list.

## 2019-06-21 NOTE — BH Assessment (Signed)
Pt presents sitting upright dressed in scrubs. He is calm, alert & oriented x 4. Pt able to get volume on TTS machine working and closed door for session. Pt credits solitude, prayer and God speaking to him, for his current stabilization. Pt states he learned his lesson and he knows what he did and is sorry. Pt reports he is ready for discharge. He was tearful about missing his family. Pt denies SI, HI, AVH & paranoia. He reports he only hears "once voice in the back of my head that tells me what to do and where to go- & that's God's voice". Pt pressed for notice of discharge and was visibly disappointed when not given. Inpt tx continues to be recommends. [facility] was called to alert assessment was  completed due to pt having closed door to his room.

## 2019-06-21 NOTE — ED Notes (Signed)
Pt began walking out of ED. Got to the waiting room and security and [employer] assisted pt to room. [employer] called

## 2019-06-21 NOTE — ED Notes (Signed)
Sheriff in to restrain to bed.  Pt is verbally aggressive and delusional.

## 2019-06-21 NOTE — ED Notes (Signed)
Pt ask me to come to room.  Once in room, pt has paper with crayons and on paper pt says, "I have drawn up these papers to have money transferred from my account to Joshua Mahoney, Should I tell [facility] when they call me again?"

## 2019-06-21 NOTE — ED Notes (Signed)
Pt is awake and coming out in hallway and asking to see the nurse.  Pt returned to room when asked.

## 2019-06-21 NOTE — ED Notes (Signed)
Pt is one telephone call number 2.

## 2019-06-21 NOTE — ED Notes (Signed)
Sheriff here to assess.

## 2019-06-21 NOTE — ED Notes (Signed)
Pt given snack and water

## 2019-06-22 MED ORDER — IBUPROFEN 400 MG PO TABS
600.0000 mg | ORAL_TABLET | Freq: Once | ORAL | Status: AC
  Administered 2019-06-22: 600 mg via ORAL
  Filled 2019-06-22: qty 2

## 2019-06-22 MED ORDER — ACETAMINOPHEN 325 MG PO TABS
650.0000 mg | ORAL_TABLET | Freq: Once | ORAL | Status: AC
  Administered 2019-06-22: 650 mg via ORAL
  Filled 2019-06-22: qty 2

## 2019-06-22 NOTE — Progress Notes (Signed)
CSW faxed recent nursing notes to [facility], requesting pt be placed on their priority waiting list due to aggressive behaviors.  Audree Camel, LCSW, [place] Disposition [place] [facility]/TTS [phone]

## 2019-06-22 NOTE — ED Notes (Signed)
Mother at bedside.  Pt is refusing all po meds this am.

## 2019-06-22 NOTE — ED Notes (Signed)
Pt ambulatory to restroom

## 2019-06-22 NOTE — ED Notes (Signed)
Pt did decide to take medication.

## 2019-06-22 NOTE — ED Notes (Addendum)
Pt yelling out at staff, verbalizing random statements regarding "the son of man" "gospel truths", says he wants the shackles off of his ankle.  This nurse to room, spoke with pt, redirected pt that he was safe and at a hospital. Informed pt that I would play gospel music until he goes to sleep and take the ankle shackle off, and place on wrist, pt was agreeable to this. PT got in bed and held up his right arm for officer to place in forensic restraint. Explained to pt that he needed to close eyes and try to go to sleep. Pt lying in bed with eyes closed at this time.

## 2019-06-22 NOTE — ED Notes (Signed)
Pt has requested to shower. Deputy escorted pt to shower.

## 2019-06-22 NOTE — ED Notes (Signed)
Pt's mother here and stating she if very worried about patient not sleeping. She says her son is not like his normal self and that he hasn't slept in days. Notified her that I contacted [facility] about this issue, with no response back. Will call [facility] again

## 2019-06-22 NOTE — ED Notes (Signed)
Pt is eating dinner now.

## 2019-06-22 NOTE — ED Notes (Signed)
Have notified Joshua Mahoney that he needs some medication changes. Pt hasn't slept since he's been here for more than 20 min increments. [name] stated she would give me a call back.

## 2019-06-22 NOTE — ED Notes (Signed)
Pt asked for ibuprofen, EDP made aware and orders given

## 2019-06-22 NOTE — ED Notes (Signed)
Pt stuck finger down throat and drank urine and vomited up pills.

## 2019-06-22 NOTE — ED Notes (Signed)
Spoke to mother today.  She is concerned that pt is not getting any better.  Reassure that [facility] is seeking placement that is appropriate for him.

## 2019-06-22 NOTE — ED Notes (Signed)
Pt standing up beside bed, yelling out again, cussing at staff members. PT continues with random conversation regarding religious beliefs, saying he "doesn't want to be crucified". Pt says, he will "negotiate and lay down if we take the shackle of his wrist." Officer at bedside. Pt continues to yell and be uncooperative. Officer and nurse explain we have tried to make him comfortable and he continues to be uncooperative. Music cut off at this time. Officer remains at bedside.

## 2019-06-22 NOTE — Consult Note (Signed)
  Patient nurse requesting medication to help patient sleep.  Medication review shows that patient has Trazodone 300 mg Q hs prn insomnia.  It doesn't appear that patient has taken any Trazodone.  Informed Lauren, RN that patient should try Trazodone before any other medications are added.     Reese Stockman B. Zali Kamaka, NP

## 2019-06-22 NOTE — ED Notes (Signed)
Pt would like to talk to Dr. Stark Jock about E.D. Will notify him

## 2019-06-22 NOTE — ED Notes (Signed)
Pt talking about Edmon Crape, the democrats, and being a prophet.

## 2019-06-22 NOTE — ED Notes (Signed)
Have provided warm blankets for patient.

## 2019-06-22 NOTE — BH Assessment (Signed)
TTS reassessment: Patient is alert and oriented x 4, sitting upright in bed. He is cooperative during assessment, however expresses frustration about his current situation. He states when he came in he was detoxing off of alcohol and nicotine. He states he was not taking his medications and that he thought everyone was out to get him. He states today he took his medications and feels much better. Per RN note patient refused meds and when he did take them he intentionally vomited pills up.  He requests shackles be removed. I explained to him that would depend on his behavior in the ED and the comfort level staff has with that. Patient begs counselor to discharge him. This counselor explained we are working to move him to another hospital. Patient demonstrated understanding stating "I'm okay with that, I'll work with you if you work with me, anywhere is better than here."   Patient continues to meet inpatient criteria and is on [facility] priority wait list.

## 2019-06-22 NOTE — ED Notes (Signed)
Pt wants a sprite. Is upset with his mom. Took medication voluntarily. Wants a dip and cigarette. Made sure to notify patient that he had a Nicotine patch on.

## 2019-06-23 MED ORDER — ACETAMINOPHEN 325 MG PO TABS
650.0000 mg | ORAL_TABLET | Freq: Once | ORAL | Status: AC
  Administered 2019-06-23: 650 mg via ORAL
  Filled 2019-06-23: qty 2

## 2019-06-23 MED ORDER — STERILE WATER FOR INJECTION IJ SOLN
INTRAMUSCULAR | Status: AC
  Administered 2019-06-23: 10 mL
  Filled 2019-06-23: qty 10

## 2019-06-23 MED ORDER — ALUM & MAG HYDROXIDE-SIMETH 200-200-20 MG/5ML PO SUSP
30.0000 mL | Freq: Once | ORAL | Status: AC
  Administered 2019-06-23: 30 mL via ORAL
  Filled 2019-06-23: qty 30

## 2019-06-23 NOTE — ED Notes (Signed)
PT's mother came to ED concerned because she had been updated that [facility] would not accept patient at their facility. Mother states that patient started having delusions and making irrational decisions in November after having issues with insomnia and was prescribed Ambien from an Urgent care then started hallucinating. Mother reports patient was seeing people outside of his house and was scared and had changed his locks multiple times due to fear and was giving all his money away and thought his family was stealing his money.

## 2019-06-23 NOTE — ED Notes (Signed)
Pt asking for something for anxiety and a sprite; pt given medications and sprite upon request

## 2019-06-23 NOTE — ED Notes (Signed)
Pt anxious in room, pacing back and forth, requesting to see the dr to be discharged today, comfort measures provided, sitter remains at bedside,

## 2019-06-23 NOTE — ED Notes (Signed)
Pt given lunch tray,

## 2019-06-23 NOTE — BH Assessment (Signed)
Phone conversation with mother. She would like to see pt getting more sleep. She reports "he is not right in his mind". Mother states she knows pt is uncomfortable in a small room awaiting psychiatric admission but states she wants to see him without delusions before he comes home.

## 2019-06-23 NOTE — Progress Notes (Signed)
CSW spoke with Stanton Kidney at [facility]. They have declined to make pt a priority placement at this time, stating that per nursing notes, pt appears to be more verbally aggressive than physical and there are no MD notes documenting aggression.   Disposition will continue to follow.   Audree Camel, LCSW, [place] Disposition [place] [facility] [place] B[facility]/TTS [phone]

## 2019-06-23 NOTE — ED Notes (Signed)
Pt started toward another pt in another room talking about "operation santa" [employer] at bedside and redirected pt into his room, pt continues to pace and talk about his operations,

## 2019-06-23 NOTE — BH Assessment (Signed)
Pt turned down volume of TV with remote, as he did when previously assessed. Pt then reported he now has his "ducks in a row". "#1 I know I am saved; #2 I know I need to get my shit together; and #3 I really want to go home, please." Pt states he father is a farmer and pt could be busy helping him. Pt states he usually gets so much done in a day, being confined to small quarters is very difficult for him. Pt states maybe he will go to boot camp and be in the service. He reports he slept well with Trazadone last night. Pt denies SI, HI and AVH. He reports he found out he has been chosen and has a new love for Jesus. Pt pleading for discharge or at least to be admitted to [facility] because he needs more space and things to do.

## 2019-06-23 NOTE — ED Notes (Signed)
Pt requesting something for heartburn and a headache, pt continues to ramble about "operation Allie Bossier" marrying a tiktok star, attempt made to redirect pt

## 2019-06-23 NOTE — ED Notes (Signed)
Pt lying in bed watching tv, sitter at bedside

## 2019-06-23 NOTE — ED Notes (Signed)
Pt updated on plan of care, denies any needs at present time, sitter remains at bedside,

## 2019-06-23 NOTE — ED Notes (Signed)
Pt still anxious, pacing in room, prn medication given,

## 2019-06-24 NOTE — ED Notes (Signed)
Dr Roderic Palau notified pt wants to speak with him

## 2019-06-24 NOTE — ED Notes (Signed)
Pt requested to speak with RN, states he really wants to speak with someone about going home. States he just really wants to spend Christmas at home. States he knows his behavior has been really bad but he just wants someone to understand him and he has changed. States he just wants to get along with everyone and show people he is good.

## 2019-06-24 NOTE — ED Provider Notes (Signed)
Patient resting comfortably Labs reviewed. I have completed his new IVC paperwork and first exam BP 122/68 (BP Location: Left Arm)   Pulse 93   Temp 98 F (36.7 C) (Oral)   Resp 18   Wt 75.8 kg   SpO2 98%   BMI 23.31 kg/m     Ripley Fraise, MD 06/24/19 [phone]

## 2019-06-24 NOTE — ED Notes (Signed)
Nicotine patch removed, new one applied to left arm

## 2019-06-24 NOTE — ED Notes (Signed)
Pt pacing the room reading out loud from the Bible. Pt states he is jesus. Continues to request to speak with MD to discuss going home.

## 2019-06-24 NOTE — ED Notes (Signed)
Pt also copying multiple bible verses on paper with crayons. States he wants to be called "lion king" now. Wants to speak with the MD about signing out. Does not want to speak with [facility] because he thinks the doctor can let him leave if he wants to.

## 2019-06-24 NOTE — ED Notes (Signed)
Pt doing push-ups in the floor, request per sitter to speak with RN. Pt request something for anxiety. Will administer prn,.

## 2019-06-25 LAB — RESPIRATORY PANEL BY RT PCR (FLU A&B, COVID)
Influenza A by PCR: NEGATIVE
Influenza B by PCR: NEGATIVE
SARS Coronavirus 2 by RT PCR: NEGATIVE

## 2019-06-25 NOTE — ED Notes (Addendum)
Pt threw ice cup in floor. Pt yelling and cussing at nurses. Pt making threats to RN states "If you don't get out of my room, I'm going to hurt you." Verbal deescalation techniques ineffective. Pt becoming more agitated. Security at bedside.

## 2019-06-25 NOTE — Progress Notes (Addendum)
Pt continues to meet inpatient criteria. Referral information has been resent to the following hospitals for review. Pt is also on the [facility] waiting list.   [facility] Details  [facility] Details [facility] Details [facility] Details  [facility] Details  [facility] Details  [facility] Details  [place] Details  [place] Details  [facility] Details  [facility] Details  [facility] Details  [facility] Details   Disposition will continue to follow for inpatient placement needs.   Audree Camel, LCSW, [place] Disposition CSW [facility]/TTS [phone]  UPDATE: CSW spoke with [facility] admissions staff. Pt continues to be on the [facility] waiting list.

## 2019-06-25 NOTE — ED Notes (Signed)
Pt came out to desk wanting to donate money to all the nurses in the hospital . Told that wasn't allowed

## 2019-06-25 NOTE — BH Assessment (Signed)
06/25/2019: Continued Inpatient is recommended. Ulice Dash @ [facility] confirmed that patient is on the wait list @ [phone]. Referrals also resent to the following hospitals for consideration:  [facility] Details  [facility] Details  [place] Details  [facility] Details  [facility] Details  [facility] Details  [facility] Details  [place] Details  [place] Details  [facility] Details  [facility] Details  [facility] Details  [facility] Details

## 2019-06-25 NOTE — ED Notes (Signed)
Security remains at bedside. Pt took Geodon without issues.

## 2019-06-25 NOTE — ED Notes (Signed)
Pt wanting ear phones and phone to listen to music. Told pt that is not allowed. Pt also wants to talk to edp about d/c. Pt informed he will have to wait for psych.

## 2019-06-25 NOTE — BH Assessment (Addendum)
[place] Assessment Progress Note   Patient was seen for re-assessment.  His affect was very bright and he denied SI/HI/Psychosis.  He stated that he slept well last night.  He was very focused and lucid during the initial part of the re-assessment, but then stated that he had connected his mind, body and soul and they were all together now.  He stated that he had begun to take note and he had a plan to change the world in business, politics and his body, he stated that it was [employer] plan.  Patient stated that he feels like he has been tempted by Lucifer and the hospital was his trial and he passed the test and he stated that he is ready to go out into the real world now.  TTS spoke to his mother and she stated that he did sound better, but was still hyper-religious.  She stated that he is not back to 100%.  Explained to her that he can get better, but it is not guaranteed that he will ever be back to 100%,  She stated that his behavior has been too volatile and that she does not feel safe for him to return home because she stated that she cannot manage him.  TTS continues to recommend inpatient treatment.

## 2019-06-25 NOTE — ED Notes (Signed)
Pt to shower

## 2019-06-25 NOTE — ED Notes (Signed)
Pt cooperative at this time. °

## 2019-06-25 NOTE — ED Notes (Signed)
[employer] signed off at this time ° °

## 2019-06-25 NOTE — ED Notes (Signed)
Pt given snack. 

## 2019-06-25 NOTE — ED Notes (Signed)
Pt requesting a shower. Encouraged to wait a few hours since it is 0300. Pt asked for anxiety med

## 2019-06-25 NOTE — ED Notes (Signed)
Pt states he feels extremely anxious and wants to go home. Pt states he wants to go home.

## 2019-06-25 NOTE — ED Notes (Signed)
Pt showered, had full linen change. He is back in his room and requesting ginger ale . Drink provided

## 2019-06-25 NOTE — ED Notes (Signed)
Pt yelling and crying "I am God, you see I am the one reading from this bible not y'all and y'all are the ones who let Lucifer go this morning." Pt remains agitated.

## 2019-06-25 NOTE — ED Notes (Signed)
Patient has bed available at [facility] on 06/26/19 after 1000. Accepting physician Dr.Thomas Coranall.   Report number [phone]

## 2019-06-25 NOTE — ED Notes (Signed)
Pt requesting new nicotine patch.

## 2019-06-25 NOTE — BH Assessment (Addendum)
Per Jonelle Sidle, patient accepted to [facility] 06/26/2019 after 10am. The accepting is Jonelle Sports, MD. Nursing report number is [phone]. Patient's nurse Sharyn Lull) made aware of patients acceptance to the facility.

## 2019-06-26 MED ORDER — POLYVINYL ALCOHOL 1.4 % OP SOLN
1.0000 [drp] | OPHTHALMIC | Status: DC | PRN
  Administered 2019-06-26: 1 [drp] via OPHTHALMIC
  Filled 2019-06-26: qty 15

## 2019-06-26 MED ORDER — ACETAMINOPHEN 325 MG PO TABS
650.0000 mg | ORAL_TABLET | Freq: Once | ORAL | Status: AC
  Administered 2019-06-26: 650 mg via ORAL
  Filled 2019-06-26: qty 2

## 2019-06-26 NOTE — ED Notes (Signed)
Report given to Estate manager/land agent at [facility].

## 2019-06-26 NOTE — ED Notes (Signed)
Pt becoming agitated. States he would like a shower to calm down. Collected his items to bathe with and calmly walked to shower.

## 2019-06-26 NOTE — Discharge Instructions (Signed)
Transfer to [facility] 

## 2019-06-26 NOTE — ED Notes (Signed)
Pt had requested something for anxiety. Gave Ativan in cup which he then threw in the sink.

## 2019-07-16 ENCOUNTER — Encounter (HOSPITAL_COMMUNITY): Payer: Self-pay

## 2019-07-16 ENCOUNTER — Emergency Department (HOSPITAL_COMMUNITY)
Admission: EM | Admit: 2019-07-16 | Discharge: 2019-07-20 | Disposition: A | Discharge status: Home / Self Care | Payer: Self-pay | Attending: Emergency Medicine | Admitting: Emergency Medicine

## 2019-07-16 DIAGNOSIS — F10259 Alcohol dependence with alcohol-induced psychotic disorder, unspecified: Secondary | ICD-10-CM | POA: Insufficient documentation

## 2019-07-16 DIAGNOSIS — F29 Unspecified psychosis not due to a substance or known physiological condition: Secondary | ICD-10-CM | POA: Insufficient documentation

## 2019-07-16 DIAGNOSIS — F17210 Nicotine dependence, cigarettes, uncomplicated: Secondary | ICD-10-CM | POA: Insufficient documentation

## 2019-07-16 DIAGNOSIS — Z20822 Contact with and (suspected) exposure to covid-19: Secondary | ICD-10-CM | POA: Insufficient documentation

## 2019-07-16 DIAGNOSIS — Z79899 Other long term (current) drug therapy: Secondary | ICD-10-CM | POA: Insufficient documentation

## 2019-07-16 LAB — CBC WITH DIFFERENTIAL/PLATELET
Abs Immature Granulocytes: 0.05 10*3/uL (ref 0.00–0.07)
Basophils Absolute: 0 10*3/uL (ref 0.0–0.1)
Basophils Relative: 0 %
Eosinophils Absolute: 0.2 10*3/uL (ref 0.0–0.5)
Eosinophils Relative: 2 %
HCT: 43.6 % (ref 39.0–52.0)
Hemoglobin: 14.7 g/dL (ref 13.0–17.0)
Immature Granulocytes: 1 %
Lymphocytes Relative: 21 %
Lymphs Abs: 2.3 10*3/uL (ref 0.7–4.0)
MCH: 31.7 pg (ref 26.0–34.0)
MCHC: 33.7 g/dL (ref 30.0–36.0)
MCV: 94.2 fL (ref 80.0–100.0)
Monocytes Absolute: 1 10*3/uL (ref 0.1–1.0)
Monocytes Relative: 9 %
Neutro Abs: 7.5 10*3/uL (ref 1.7–7.7)
Neutrophils Relative %: 67 %
Platelets: 224 10*3/uL (ref 150–400)
RBC: 4.63 MIL/uL (ref 4.22–5.81)
RDW: 12.1 % (ref 11.5–15.5)
WBC: 11 10*3/uL — ABNORMAL HIGH (ref 4.0–10.5)
nRBC: 0 % (ref 0.0–0.2)

## 2019-07-16 LAB — COMPREHENSIVE METABOLIC PANEL
ALT: 26 U/L (ref 0–44)
AST: 21 U/L (ref 15–41)
Albumin: 4.1 g/dL (ref 3.5–5.0)
Alkaline Phosphatase: 43 U/L (ref 38–126)
Anion gap: 7 (ref 5–15)
BUN: 11 mg/dL (ref 6–20)
CO2: 28 mmol/L (ref 22–32)
Calcium: 9.1 mg/dL (ref 8.9–10.3)
Chloride: 106 mmol/L (ref 98–111)
Creatinine, Ser: 0.77 mg/dL (ref 0.61–1.24)
GFR calc Af Amer: >60 mL/min (ref >60)
GFR calc non Af Amer: >60 mL/min (ref >60)
Glucose, Bld: 95 mg/dL (ref 70–99)
Potassium: 3.9 mmol/L (ref 3.5–5.1)
Sodium: 141 mmol/L (ref 135–145)
Total Bilirubin: 0.7 mg/dL (ref 0.3–1.2)
Total Protein: 6.9 g/dL (ref 6.5–8.1)

## 2019-07-16 LAB — RESPIRATORY PANEL BY RT PCR (FLU A&B, COVID)
Influenza A by PCR: NEGATIVE
Influenza B by PCR: NEGATIVE
SARS Coronavirus 2 by RT PCR: NEGATIVE

## 2019-07-16 LAB — ETHANOL: Alcohol, Ethyl (B): <10 mg/dL (ref <10)

## 2019-07-16 LAB — RAPID URINE DRUG SCREEN, HOSP PERFORMED
Amphetamines: NOT DETECTED
Barbiturates: NOT DETECTED
Benzodiazepines: NOT DETECTED
Cocaine: NOT DETECTED
Opiates: NOT DETECTED
Tetrahydrocannabinol: NOT DETECTED

## 2019-07-16 LAB — SALICYLATE LEVEL: Salicylate Lvl: <7 mg/dL — ABNORMAL LOW (ref 7.0–30.0)

## 2019-07-16 LAB — ACETAMINOPHEN LEVEL: Acetaminophen (Tylenol), Serum: <10 ug/mL — ABNORMAL LOW (ref 10–30)

## 2019-07-16 MED ORDER — LORAZEPAM 2 MG/ML IJ SOLN: 1.0000 mg | Freq: Once | INTRAMUSCULAR | Status: DC

## 2019-07-16 MED ORDER — LORAZEPAM 1 MG PO TABS
2.0000 mg | ORAL_TABLET | Freq: Once | ORAL | Status: AC
  Administered 2019-07-16: 2 mg via ORAL
  Filled 2019-07-16: qty 2

## 2019-07-16 MED ORDER — OLANZAPINE 5 MG PO TBDP
20.0000 mg | ORAL_TABLET | Freq: Once | ORAL | Status: AC
  Administered 2019-07-16: 20 mg via ORAL
  Filled 2019-07-16: qty 4

## 2019-07-16 MED ORDER — LORAZEPAM 1 MG PO TABS
1.0000 mg | ORAL_TABLET | Freq: Once | ORAL | Status: AC
  Administered 2019-07-16: 18:00:00 1 mg via ORAL
  Filled 2019-07-16: qty 1

## 2019-07-16 NOTE — ED Notes (Signed)
Pt now resting comfortably. Verified with Dr. Denton Lank to allow pt to sleep and not awaken him to place on cardiac monitor and get EKG until he awakens.

## 2019-07-16 NOTE — ED Notes (Signed)
Pt given dinner tray.

## 2019-07-16 NOTE — ED Notes (Signed)
Pt shirt changed into psych top.

## 2019-07-16 NOTE — ED Provider Notes (Signed)
Emergency Department Provider Note   I have reviewed the triage vital signs and the nursing notes.   HISTORY  Chief Complaint Altered Mental Status   HPI Joshua Mahoney is a 24 y.o. male with history of polysubstance abuse, psychosis and not on his medications who presents the emergency department today secondary to increasing anxiety, hallucinations and violent thoughts.  On review of the records patient has been significantly violent in the past requiring multiple antipsychotics and restraints.  Patient states he feels like he is get to that point again.  He wants to talk to his mother.   No other associated or modifying symptoms.   Level V caveat 2/2 psychiatric condition  Past Medical History:  Diagnosis Date  . Polysubstance abuse [facility])     Patient Active Problem List   Diagnosis Date Noted  . Psychosis (HCC) 06/04/2019    History reviewed. No pertinent surgical history.  Current Outpatient Rx  . Order #: [id number] Class: Print  . Order #: [id number] Class: Historical Med  . Order #: [id number] Class: Print  . Order #: [id number] Class: Print  . Order #: [id number] Class: Historical Med  . Order #: [id number] Class: Print    Allergies Patient has no known allergies.  No family history on file.  Social History Social History   Tobacco Use  . Smoking status: Current Every Day Smoker    Packs/day: 1.00    Types: Cigarettes  . Smokeless tobacco: Never Used  Substance Use Topics  . Alcohol use: Yes    Comment: bottle of wine last night  . Drug use: Not Currently    Comment: heroin, weed, xanax last use years ago    Review of Systems  All other systems negative except as documented in the HPI. All pertinent positives and negatives as reviewed in the HPI. ____________________________________________   PHYSICAL EXAM:  VITAL SIGNS: ED Triage Vitals  Enc Vitals Group     BP 07/16/19 0610 (!) 158/109     Pulse Rate 07/16/19 0610 (!) 118     Resp 07/16/19 0610  18     Temp 07/16/19 0610 97.8 F (36.6 C)     Temp Source 07/16/19 0610 Oral     SpO2 07/16/19 0610 100 %    Constitutional: Alert and oriented. Well appearing but pacing, abnormal behavior. Eyes: Conjunctivae are normal. PERRL. EOMI. Head: Atraumatic. Nose: No congestion/rhinnorhea. Mouth/Throat: Mucous membranes are moist.  Oropharynx non-erythematous. Neck: No stridor.  No meningeal signs.   Cardiovascular: Normal rate, regular rhythm. Good peripheral circulation. Grossly normal heart sounds.   Respiratory: Normal respiratory effort.  No retractions. Lungs CTAB. Gastrointestinal: Soft and nontender. No distention.  Musculoskeletal: No lower extremity tenderness nor edema. No gross deformities of extremities. Neurologic:  Normal speech and language. No gross focal neurologic deficits are appreciated.  Skin:  Skin is warm, dry and intact. No rash noted. Psych: pacing, anxious, hallucinating, odd affect, abnormal behavior  ____________________________________________   LABS (all labs ordered are listed, but only abnormal results are displayed)  Labs Reviewed  RESPIRATORY PANEL BY RT PCR (FLU A&B, COVID)  COMPREHENSIVE METABOLIC PANEL  ETHANOL  RAPID URINE DRUG SCREEN, HOSP PERFORMED  CBC WITH DIFFERENTIAL/PLATELET  ACETAMINOPHEN LEVEL  SALICYLATE LEVEL   ____________________________________________  EKG   EKG Interpretation  Date/Time:    Ventricular Rate:    PR Interval:    QRS Duration:   QT Interval:    QTC Calculation:   R Axis:     Text Interpretation:  ____________________________________________  RADIOLOGY  No results found.  ____________________________________________   PROCEDURES  Procedure(s) performed:   Procedures   ____________________________________________   INITIAL IMPRESSION / ASSESSMENT AND PLAN / ED COURSE  Based on history and similar presentation today, IVC'ed. Is willing to try oral medications for now, but low  threshold for IM. Will medically clear for tts consult for acute decompensated psychosis.   Pertinent labs & imaging results that were available during my care of the patient were reviewed by me and considered in my medical decision making (see chart for details). ____________________________________________  FINAL CLINICAL IMPRESSION(S) / ED DIAGNOSES  Final diagnoses:  None     MEDICATIONS GIVEN DURING THIS VISIT:  Medications  OLANZapine zydis (ZYPREXA) disintegrating tablet 20 mg (has no administration in time range)  LORazepam (ATIVAN) tablet 2 mg (has no administration in time range)     NEW OUTPATIENT MEDICATIONS STARTED DURING THIS VISIT:  New Prescriptions   No medications on file    Note:  This note was prepared with assistance of Dragon voice recognition software. Occasional wrong-word or sound-a-like substitutions may have occurred due to the inherent limitations of voice recognition software.   Merrily Pew, MD 07/17/19 [phone]

## 2019-07-16 NOTE — ED Notes (Signed)
Pt becoming increasingly agitated, pt attempting to get into cabinets and pacing the room. Pt yelling at staff members. Pt redirected at this time and calmed. Security at bedside.

## 2019-07-16 NOTE — ED Notes (Signed)
Pt given two meal tray per pt request.

## 2019-07-16 NOTE — ED Notes (Signed)
Pt was restless and somewhat agitated at time of shift change. Previous RN attempted to place pt on cardiac monitor and pt would remove it every time. Previous RN made EDP aware and said he was okay with this until pt became more compliant as to not agitate him more. Pt was given medication to help him relax. Pt is just now starting to relax. Will attempt to place pt on cardiac monitor and get EKG once he is more compliant.

## 2019-07-16 NOTE — ED Notes (Addendum)
Mother called and states patient is mixing medications with alcohol and if pt does not get alcohol, pt gets very aggressive.

## 2019-07-16 NOTE — ED Notes (Signed)
TTS in progress 

## 2019-07-16 NOTE — BH Assessment (Signed)
Pt sleeping at this time- TTS will be attempted later.

## 2019-07-16 NOTE — ED Triage Notes (Signed)
Pt brought in by [facility] for "altered mental status".  Pt apparently called ems 8 times over the past several hours wanting to be taken to the hospital.  When questioned, pt is unable to specify why he wants to be here, just states "I don't know".   Pt will not stay in the room, continually going to the sink, turning the water off and on, sitting down, standing up, agitated at times, drinking from the sink.

## 2019-07-16 NOTE — ED Notes (Signed)
[facility] called pt will be in patient

## 2019-07-16 NOTE — ED Notes (Signed)
Pt becoming agitated. Pt wanting to leave.
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
## 2019-07-16 NOTE — ED Notes (Signed)
Pt pacing the floors at this time.

## 2019-07-17 MED ORDER — ZIPRASIDONE MESYLATE 20 MG IM SOLR
10.0000 mg | Freq: Once | INTRAMUSCULAR | Status: AC
  Administered 2019-07-17: 10 mg via INTRAMUSCULAR
  Filled 2019-07-17: qty 20

## 2019-07-17 MED ORDER — STERILE WATER FOR INJECTION IJ SOLN
INTRAMUSCULAR | Status: AC
  Filled 2019-07-17: qty 10

## 2019-07-17 MED ORDER — LORAZEPAM 1 MG PO TABS
1.0000 mg | ORAL_TABLET | Freq: Once | ORAL | Status: AC
  Administered 2019-07-17: 16:00:00 1 mg via ORAL
  Filled 2019-07-17: qty 1

## 2019-07-17 MED ORDER — NICOTINE 14 MG/24HR TD PT24
14.0000 mg | MEDICATED_PATCH | Freq: Every day | TRANSDERMAL | Status: DC
  Administered 2019-07-17 – 2019-07-19 (×3): 14 mg via TRANSDERMAL
  Filled 2019-07-17 (×3): qty 1

## 2019-07-17 NOTE — ED Notes (Signed)
Patient ate breakfast and showered

## 2019-07-17 NOTE — ED Notes (Signed)
Patient awake, asking for coffee. Patient back and forth to restroom. Patient had large BM stopped up the toilet.
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
## 2019-07-17 NOTE — ED Notes (Signed)
Patient up out of bed wanting to walk around the nursing station, reminded patient about possible exposure and patient safety. Patient redirecterd back to .bed

## 2019-07-17 NOTE — ED Notes (Signed)
Patient asking to use the telephone ti call someone. Reminded patient what time it was and he could use telephone later this morning.Patient keeps taking his mask off. Several staff members including my self that he has to keep it on for his safety as well as the staff.

## 2019-07-17 NOTE — Progress Notes (Signed)
Per Denzil Magnuson, NP, pt continues to meet inpatient criteria. There are no appropriate beds available at [facility] currently. Pt was referred out to the following facilities for review:  [facility], [facility], [place], [facility], [place], [address], [place], [facility], [place].  TTS continuing to assess.   Keandria Berrocal S. Alan Ripper, MSW, LCSW Clinical Social Worker 07/17/2019 11:22 AM

## 2019-07-17 NOTE — ED Notes (Signed)
Patient walked to restroom. Patient in bed

## 2019-07-17 NOTE — ED Notes (Signed)
Pt given graham crackers and water to drink.

## 2019-07-17 NOTE — ED Notes (Signed)
Leg shackles removed by [employer]

## 2019-07-17 NOTE — ED Notes (Signed)
Patient awake at this time. Patient asking for coffee, reminded patient that he could not have caffeine at this time. Patient given Maryann Conners to drink. Patient states that he is not "fucking drinking it. Patient raising his voice at staff yelling he needs something to fucking eat. Patient told that type of behavior will not be accepted. Patient states that we only been giving him crap to eat. RN  Claris Gower gave patient a tray from fridge.

## 2019-07-18 ENCOUNTER — Encounter (HOSPITAL_COMMUNITY): Payer: Self-pay | Admitting: Emergency Medicine

## 2019-07-18 MED ORDER — HALOPERIDOL 5 MG PO TABS
10.0000 mg | ORAL_TABLET | Freq: Four times a day (QID) | ORAL | Status: DC | PRN
  Administered 2019-07-19: 15:00:00 10 mg via ORAL
  Filled 2019-07-18: qty 2

## 2019-07-18 MED ORDER — HALOPERIDOL 5 MG PO TABS: 10.0000 mg | ORAL_TABLET | Freq: Once | ORAL | Status: DC

## 2019-07-18 MED ORDER — ZIPRASIDONE MESYLATE 20 MG IM SOLR
10.0000 mg | Freq: Once | INTRAMUSCULAR | Status: AC
  Administered 2019-07-18: 10 mg via INTRAMUSCULAR
  Filled 2019-07-18: qty 20

## 2019-07-18 MED ORDER — TRAZODONE HCL 50 MG PO TABS
100.0000 mg | ORAL_TABLET | Freq: Every day | ORAL | Status: DC
  Administered 2019-07-18 – 2019-07-19 (×2): 100 mg via ORAL
  Filled 2019-07-18 (×2): qty 2

## 2019-07-18 MED ORDER — PRAZOSIN HCL 1 MG PO CAPS
2.0000 mg | ORAL_CAPSULE | Freq: Every day | ORAL | Status: DC
  Administered 2019-07-19: 2 mg via ORAL
  Filled 2019-07-18: qty 1
  Filled 2019-07-18 (×2): qty 2

## 2019-07-18 MED ORDER — LORAZEPAM 2 MG/ML IJ SOLN: 2.0000 mg | Freq: Four times a day (QID) | INTRAMUSCULAR | Status: DC | PRN

## 2019-07-18 MED ORDER — LORAZEPAM 1 MG PO TABS
2.0000 mg | ORAL_TABLET | Freq: Four times a day (QID) | ORAL | Status: DC | PRN
  Administered 2019-07-19: 2 mg via ORAL
  Filled 2019-07-18: qty 2

## 2019-07-18 MED ORDER — LORAZEPAM 1 MG PO TABS
1.0000 mg | ORAL_TABLET | Freq: Four times a day (QID) | ORAL | Status: DC | PRN
  Administered 2019-07-18: 1 mg via ORAL
  Filled 2019-07-18: qty 1

## 2019-07-18 MED ORDER — OXCARBAZEPINE 300 MG PO TABS
300.0000 mg | ORAL_TABLET | Freq: Two times a day (BID) | ORAL | Status: DC
  Administered 2019-07-18 – 2019-07-20 (×5): 300 mg via ORAL
  Filled 2019-07-18 (×7): qty 1

## 2019-07-18 MED ORDER — HALOPERIDOL LACTATE 5 MG/ML IJ SOLN
5.0000 mg | Freq: Once | INTRAMUSCULAR | Status: AC
  Administered 2019-07-18: 5 mg via INTRAMUSCULAR
  Filled 2019-07-18: qty 1

## 2019-07-18 MED ORDER — HALOPERIDOL LACTATE 5 MG/ML IJ SOLN: 10.0000 mg | Freq: Once | INTRAMUSCULAR | Status: DC

## 2019-07-18 MED ORDER — OLANZAPINE 5 MG PO TABS
10.0000 mg | ORAL_TABLET | Freq: Two times a day (BID) | ORAL | Status: DC
  Administered 2019-07-18 – 2019-07-20 (×4): 10 mg via ORAL
  Filled 2019-07-18 (×4): qty 2

## 2019-07-18 MED ORDER — LORAZEPAM 2 MG/ML IJ SOLN: 2.0000 mg | Freq: Once | INTRAMUSCULAR | Status: DC

## 2019-07-18 MED ORDER — HALOPERIDOL LACTATE 5 MG/ML IJ SOLN: 10.0000 mg | Freq: Four times a day (QID) | INTRAMUSCULAR | Status: DC | PRN

## 2019-07-18 MED ORDER — LORAZEPAM 1 MG PO TABS: 2.0000 mg | ORAL_TABLET | Freq: Once | ORAL | Status: DC

## 2019-07-18 NOTE — ED Notes (Signed)
meds from [facility]

## 2019-07-18 NOTE — ED Notes (Signed)
Pt shouting and requesting help   Asking for [place]   Orders received   Dr Estell Harpin request call to [facility] to ask for assistance when pt is out of control

## 2019-07-18 NOTE — ED Notes (Signed)
Meal provided   Pt has resumed his resting, eyes closed with ever, unlabored respirations   While awake he was asked by N how he felt  His reply , "better"

## 2019-07-18 NOTE — ED Notes (Signed)
Pt noted to bypass the bathroom to go down hall to exit  Redirected to room by sitter

## 2019-07-18 NOTE — ED Notes (Signed)
Restless, pacing room   Denies A/V hallucinations; ? Whether he is responding to internal stimuli as evidenced by reluctant to meet eye contact, pacing and looking at corner of the room repeatedly versus merely anxious Denies SI/HI  Non tremulous, request food  pressured speech and reports "trying to express myself"

## 2019-07-18 NOTE — ED Notes (Signed)
Pt given meds- advised that these are the medications that he was prescribed by [facility] - he nods when asked if that is right in affirmative   He admits to hallucinations but states that they are not as bad as before - hitherto before today he has denied to this N any hallucinations  He willingly takes his medication and returns to bed for restful sleep

## 2019-07-18 NOTE — ED Notes (Signed)
Pt returned to room and began throwing objects in room and advancing to staff in threatening manner  Security called for show of support - to room along with [employer]   Mother called updated [phone]  She reports she gives him his medicine morning and night   She also states that she took the pt to [facility] and that "he didn't stay"

## 2019-07-18 NOTE — Progress Notes (Signed)
CSW contacted referral facilities with the following results:  Still reviewing: [facility] (no answer) [facility] (no answer) [facility] (no answer) [facility]  Declined: [facility] (uninsured) [facility] (at capacity) [facility] (at capacity) [facility] (at capacity) [facility] (at capacity) [facility] (no programming) [facility] (uninsured)  TTS will continue to seek bed placement.   Vilma Meckel. Algis Greenhouse, MSW, LCSW Clinical Social Work/Disposition Phone: [phone] Fax: [phone]

## 2019-07-18 NOTE — ED Notes (Signed)
[facility]   Concern that pt has been calmed and is sleeping   Is Zyprexa scheduled too soon and since he is resting should dose be rescheduled  Pharm states does should be rescheduled and she will reschedule

## 2019-07-18 NOTE — Consult Note (Signed)
At 15:30 Restarted home medications.   Consulted with MD Cobos for agitation protocol, chart reviewed 07/16/2019 EKG Qtc 445, No know medication allergies.   Haldol 10mg  by mouth or intermuscular Q6 hours  And Ativan 2mg  po or IM for server agitation

## 2019-07-18 NOTE — ED Notes (Signed)
Pt repeatedly with face in the sink soaking face   He is gently reminded that it is sink water, not living, nor holy water and escorted to his bed   [employer] deputy here to assist due to behavioral disturbance and pt attempt to leave as well as dismantle the room

## 2019-07-18 NOTE — ED Notes (Signed)
Patient is being very loud and disruptive. He is hand cuffed to the bed

## 2019-07-18 NOTE — ED Notes (Signed)
Med orders from [facility] after request

## 2019-07-18 NOTE — ED Notes (Signed)
Pt has wrapped himself in the fitted sheet   He is covered with a warm blanket, opens eyes and is reassured that he is safe and we want him comfortable

## 2019-07-18 NOTE — ED Notes (Signed)
Call to Santo Held, RN, [facility] to ask for med assist/recommendation from [facility]

## 2019-07-18 NOTE — BHH Counselor (Signed)
Pt presented to the ED last week with altered mental status.  As of yesterday, Pt reported that he was [employer].  Today, Pt was reassessed.  He paced room and adjusted bedding.  Pt stated that he came to the ED because he was having a panic attack.  He denied SI, HI, and AVH.  Pt stated that he felt he was going to have another panic attack because he was denied access to the restroom (discomfirmed by attending nurse who advised that Pt had just returned from rest room where he had a shower, had access to facilities).    Per notes, Pt has stated that he is [address] and the prophet [name].  When asked whether he was [name], Pt said that it was up to ''you people'' to decide. When asked what he wanted to do today, Pt stated, ''I want to use the restroom.''  Pt had little insight into presenting concern.  Recommend continued inpatient placement.

## 2019-07-18 NOTE — ED Notes (Signed)
Officer has handcuffed pt to stretcher after his inability to follow instruction   His R extremity is loosely cuffed to the rail with both Furniture conservator/restorer at [employer]

## 2019-07-18 NOTE — ED Notes (Signed)
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

## 2019-07-19 NOTE — ED Notes (Signed)
[facility] called, [facility] is looking [facility] but pt will be re-evaluated again today.

## 2019-07-19 NOTE — Progress Notes (Signed)
CSW has completed a [facility] Referral and has faxed it. CSW is awaiting phone call from intake at [facility] to confirm that the patient is on the waitlist.   CSW will continue to follow and assist with disposition planning.   Drucilla Schmidt, MSW, LCSW-A Clinical Disposition Social Worker [facility]/TTS [phone]

## 2019-07-19 NOTE — Progress Notes (Signed)
Patient ID: Joshua Mahoney, male   DOB: 1995-10-22, 24 y.o.   MRN: [id number]   I spoke to patients nurse this morning, Daphane who states patient has done fine this morning without any behavioral issues. Patients home medications were restarted yesterday by [facility] NP. Per chart review patient is complaint with taking medications. Patient has however had significant outburst while in the ED that has required restraints and PRN;s for agitation. We will continue to monitor patients mood and behavior. [facility] team has discussed [facility] referral which will be placed today by CSW.

## 2019-07-19 NOTE — BH Assessment (Signed)
Pt presents walking around his room. He asked to close the door for assessment. Pt appears somewhat drowsy. Pt denies current SI. States he is having a problem with "real bad panic attacks". He states the panic attacks are due to "something like a chip in my head". Pt admits feeling paranoid. He denies current AVH. He states he sometimes feels like [address], but right now he is "just an ordinary little boy". Inpt psychiatric tx continues to be recommended.

## 2019-07-19 NOTE — ED Notes (Signed)
Pt is restless.

## 2019-07-20 NOTE — Progress Notes (Signed)
Patient ID: Joshua Mahoney, male   DOB: 1996-04-30, 24 y.o.   MRN: [id number]  Reassessment  HPI: Joshua Mahoney is a 24 y.o. male who was brought to [facility] via EMS due to "altered mental status;" pt apparently called EMS 8 times over several hours wanting to be taken to the [facility]. Upon arrival, pt was unable to give a reason as to why he wanted to come to the [facility]. Pt has been seen at [facility] ED numerous times over the last 2 months; during each visit, he has been experiencing AVH and has been using EtOH and/or marijuana (pt denies any use other than EtOH, but the marijuana was caught in a UDA screen). Today, pt was irritable with clinician, stating he was at the ED due to having a panic attack in which he thought lucifer had bought his soul, which he states had happened in the past.  Pt denies SI or a hx of SI, any prior attempts to kill himself, any plan to kill himself, and states he was hospitalized at [facility] for 11 days at the end of 2020; pt has also been hospitalized at [facility] in the past. Pt denies HI, NSSIB, and access to guns/weapons. Pt reports he has a court date on 08/03/2019 for a DWI and that he drinks an average of a 12-pack of 12 oz beers/night.  Psychiatric reassessment: This is a 24 year old male who presented to the ED for concerns as noted above. During this evaluation, he presents as talert, attentive and calm although vaguely guarded.There is no psychomotor agitation noted.  He endorses improvement in mood. He states," I feel better. I feel like I am coping with my thoughts." He reports he no longer feels as though he is [employer] and states," I feel like I am myself now." He does not appear religiously preoccupied. He denies SI, HI or AVH. He endorses is feelings of paranoia has decreased since getting back on his home medications which were restarted while in the ED and he has been complaint. Since restarting his medications, there is no documented behaviors of aggression.  He reports his sleeping pattern has improved. Reports improvement in anxiety. Patient does have a significant history of alcohol addiction/polysubstance abuse. His UDS and ethanol was however, negative.   Patient is more settled, stable and seems to be at baseline.   Disposition:  Patient has been psychiatrically hospitalized since 07/15/2018. He initially  presented as restless and agitated. Patient has HAD similar presentations as this in the past. He has made progress since restarting his home medications. He denies SI, HI or AVH. Reports feeling less paranoid and anxious. He case was discussed with Joshua Mahoney team this morning. We have all agreed that he is stable for discharge as such, he is psychiatrically cleared. CSW here at [place] [facility] did speak with patients mother to discuss concerns and disposition. Per CSW, mother is willingly to pick him up. Per CSW, there was some concerns about patient substance abuse. As per CSW, duel diagnosis treatment program was discussed. CSW plan are to fax over resources for these resources to include [facility]. Patient again I psychiatrically cleared.   ED updated on current disposition/psychiatric clearance.

## 2019-07-20 NOTE — ED Provider Notes (Addendum)
Patient cleared by psychiatry.  Commitment papers rescinded.   Donnetta Hutching, MD 07/20/19 1103    Donnetta Hutching, MD 07/20/19 1104

## 2019-07-20 NOTE — Progress Notes (Signed)
[facility] authorization number: [id number]  [facility] pt demographic interview completed this morning.   Pt is currently being reviewed for [facility] placement.   Wells Guiles, LCSW, LCAS Disposition CSW [facility]/TTS [phone]

## 2019-07-20 NOTE — Discharge Instructions (Addendum)
Follow-up with community mental health resources °

## 2019-07-20 NOTE — Progress Notes (Signed)
Pt has been psychiatrically cleared. Legal guardian (mother Earley Abide) was notified and will come to [facility] to pick pt up shortly. She shared that she lives with pt and is concerned about his alcohol use. She feels he is compliant with his medications, but reports that his alcohol use worsens his MH symptoms. She reports that pt has shared with her of feeling that his alcohol use is a problem. CSW encouraged mother to assist pt with engaging in a Dual Diagnosis outpatient therapy program or [facility]. Outpatient resources have been faxed to [facility] for she and pt to review. She also shared that she helped pt apply for Medicaid benefits last week.   Wells Guiles, LCSW, LCAS Disposition CSW [facility] [phone]
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
